# Patient Record
Sex: Female | Born: 1935 | Race: White | Hispanic: No | Marital: Single | Smoking: Never smoker
Health system: Southern US, Community
[De-identification: ages and names within clinical notes are randomized; demographics above are authoritative.]

## PROBLEM LIST (undated history)

## (undated) DIAGNOSIS — D649 Anemia, unspecified: Secondary | ICD-10-CM

## (undated) DIAGNOSIS — K449 Diaphragmatic hernia without obstruction or gangrene: Secondary | ICD-10-CM

## (undated) DIAGNOSIS — E119 Type 2 diabetes mellitus without complications: Secondary | ICD-10-CM

## (undated) DIAGNOSIS — K7460 Unspecified cirrhosis of liver: Secondary | ICD-10-CM

## (undated) DIAGNOSIS — K259 Gastric ulcer, unspecified as acute or chronic, without hemorrhage or perforation: Secondary | ICD-10-CM

## (undated) HISTORY — PX: CHOLECYSTECTOMY: SHX55

## (undated) HISTORY — DX: Anemia, unspecified: D64.9

## (undated) HISTORY — PX: KNEE SURGERY: SHX244

## (undated) HISTORY — DX: Unspecified cirrhosis of liver: K74.60

---

## 2010-10-02 ENCOUNTER — Ambulatory Visit: Payer: Self-pay | Admitting: Internal Medicine

## 2011-09-25 ENCOUNTER — Encounter (INDEPENDENT_AMBULATORY_CARE_PROVIDER_SITE_OTHER): Payer: Self-pay | Admitting: *Deleted

## 2011-10-04 ENCOUNTER — Ambulatory Visit (INDEPENDENT_AMBULATORY_CARE_PROVIDER_SITE_OTHER): Payer: Medicare Other | Admitting: Internal Medicine

## 2011-10-04 ENCOUNTER — Encounter (INDEPENDENT_AMBULATORY_CARE_PROVIDER_SITE_OTHER): Payer: Self-pay | Admitting: Internal Medicine

## 2011-10-04 VITALS — BP 120/64 | HR 60 | Temp 98.4°F | Ht 63.0 in | Wt 162.2 lb

## 2011-10-04 DIAGNOSIS — D509 Iron deficiency anemia, unspecified: Secondary | ICD-10-CM

## 2011-10-04 DIAGNOSIS — K7460 Unspecified cirrhosis of liver: Secondary | ICD-10-CM

## 2011-10-04 NOTE — Progress Notes (Signed)
Subjective:     Patient ID: Amanda Gay, female   DOB: [birth year], 77 y.o.   MRN: [id number]  HPI  Amanda Gay is a 77 yr old female here today for f/u.  She has a hx of iron deficiency anemia.  Her last iron infusion was in 02/01/2011.  She see Dr. Ubaldo Glassing for this. Her last colonoscopy was either in 2011 or 2010 which was normal except for a polyp per patient.  She also has a hx of hepatic cirrhosis and splenomegaly.  She will have some swelling in her lower ankles if she stand a lot or rides a lot. No periods of confusion. No confusion.  No asterisk.  Her appetite is good though she tells me she has problems with taste. 10/04/2011 H and H 13.5 and 39.0, Platelet ct 115 down from 140. Iron 74, Transferrin 241, TIBC 337, Saturation 22.   No melena or bright red rectal bleeding.  Usually has one BM daily. Review of Systems see hpi Current Outpatient Prescriptions  Medication Sig Dispense Refill  . diltiazem (TIAZAC) 360 MG 24 hr capsule Take 360 mg by mouth daily.        . furosemide (LASIX) 20 MG tablet Take 20 mg by mouth 2 (two) times daily.        . metFORMIN (GLUMETZA) 500 MG (MOD) 24 hr tablet Take 500 mg by mouth daily with breakfast. Two in am and one at night       . pantoprazole (PROTONIX) 40 MG tablet Take 40 mg by mouth daily.        . potassium chloride (KLOR-CON) 10 MEQ CR tablet Take 10 mEq by mouth daily.        . propranolol (INDERAL) 20 MG tablet Take 20 mg by mouth 3 (three) times daily.        Marland Kitchen spironolactone (ALDACTONE) 50 MG tablet Take 50 mg by mouth daily.        . temazepam (RESTORIL) 15 MG capsule Take 15 mg by mouth at bedtime as needed.        NKA    Objective:   Physical Exam Filed Vitals:   10/04/11 1604  BP: 120/64  Pulse: 60  Temp: 98.4 F (36.9 C)  Height: 5\' 3"  (1.6 m)  Weight: 162 lb 3.2 oz (73.573 kg)   Alert and oriented. Skin warm and dry. Oral mucosa is moist. Natural teeth in good condition. Sclera anicteric, conjunctivae is pink. Thyroid not  enlarged. No cervical lymphadenopathy. Lungs clear. Heart regular rate and rhythm.  Abdomen is soft. Bowel sounds are positive. No hepatomegaly.Unable to palpate the spleen.  No abdominal masses felt. No tenderness.  No edema to lower extremities. Patient is alert and oriented. No asterisk.     Assessment:    Hx of cirrhoisis either cryptogenic or secondary to NAFLD with grade 1-2 varies.  Iron deficiency anemia followed by Dr Ubaldo Glassing. Her H and H 10/04/11 13.5 and 39.0    Plan:    Follow up in one year. Will get a C-met and an AFP on her today. Amanda Gay was satisfied with the care she received today. All questions were answered.

## 2011-10-04 NOTE — Patient Instructions (Signed)
Will get a Cmet and AFP on her today and she will follow up in one year.

## 2011-10-05 LAB — COMPREHENSIVE METABOLIC PANEL
AST: 23 U/L (ref 0–37)
Alkaline Phosphatase: 83 U/L (ref 39–117)
BUN: 18 mg/dL (ref 6–23)
Creat: 0.82 mg/dL (ref 0.50–1.10)
Potassium: 4.1 mEq/L (ref 3.5–5.3)

## 2012-09-24 ENCOUNTER — Encounter (INDEPENDENT_AMBULATORY_CARE_PROVIDER_SITE_OTHER): Payer: Self-pay | Admitting: *Deleted

## 2012-10-06 ENCOUNTER — Ambulatory Visit (INDEPENDENT_AMBULATORY_CARE_PROVIDER_SITE_OTHER): Payer: Medicare Other | Admitting: Internal Medicine

## 2012-10-08 ENCOUNTER — Encounter: Payer: Medicare Other | Admitting: Internal Medicine

## 2012-10-08 DIAGNOSIS — D638 Anemia in other chronic diseases classified elsewhere: Secondary | ICD-10-CM

## 2012-10-08 DIAGNOSIS — D509 Iron deficiency anemia, unspecified: Secondary | ICD-10-CM

## 2012-10-08 DIAGNOSIS — E538 Deficiency of other specified B group vitamins: Secondary | ICD-10-CM

## 2012-10-15 DIAGNOSIS — D649 Anemia, unspecified: Secondary | ICD-10-CM

## 2012-10-15 DIAGNOSIS — E538 Deficiency of other specified B group vitamins: Secondary | ICD-10-CM

## 2012-10-17 DIAGNOSIS — D649 Anemia, unspecified: Secondary | ICD-10-CM

## 2012-11-11 DIAGNOSIS — L259 Unspecified contact dermatitis, unspecified cause: Secondary | ICD-10-CM

## 2012-11-11 DIAGNOSIS — D509 Iron deficiency anemia, unspecified: Secondary | ICD-10-CM

## 2012-11-11 DIAGNOSIS — E538 Deficiency of other specified B group vitamins: Secondary | ICD-10-CM

## 2012-12-12 DIAGNOSIS — E538 Deficiency of other specified B group vitamins: Secondary | ICD-10-CM

## 2012-12-12 DIAGNOSIS — D509 Iron deficiency anemia, unspecified: Secondary | ICD-10-CM

## 2012-12-12 DIAGNOSIS — R5381 Other malaise: Secondary | ICD-10-CM

## 2012-12-12 DIAGNOSIS — R5383 Other fatigue: Secondary | ICD-10-CM

## 2013-01-08 DIAGNOSIS — D649 Anemia, unspecified: Secondary | ICD-10-CM

## 2013-01-08 DIAGNOSIS — E538 Deficiency of other specified B group vitamins: Secondary | ICD-10-CM

## 2013-02-05 DIAGNOSIS — E538 Deficiency of other specified B group vitamins: Secondary | ICD-10-CM

## 2013-02-05 DIAGNOSIS — D649 Anemia, unspecified: Secondary | ICD-10-CM

## 2013-02-05 DIAGNOSIS — R161 Splenomegaly, not elsewhere classified: Secondary | ICD-10-CM

## 2013-02-16 DIAGNOSIS — D509 Iron deficiency anemia, unspecified: Secondary | ICD-10-CM

## 2013-02-23 DIAGNOSIS — D509 Iron deficiency anemia, unspecified: Secondary | ICD-10-CM

## 2013-03-05 DIAGNOSIS — E538 Deficiency of other specified B group vitamins: Secondary | ICD-10-CM

## 2013-04-02 ENCOUNTER — Encounter: Payer: Medicare Other | Admitting: Internal Medicine

## 2013-04-02 DIAGNOSIS — D638 Anemia in other chronic diseases classified elsewhere: Secondary | ICD-10-CM

## 2013-04-02 DIAGNOSIS — D509 Iron deficiency anemia, unspecified: Secondary | ICD-10-CM

## 2013-04-02 DIAGNOSIS — E538 Deficiency of other specified B group vitamins: Secondary | ICD-10-CM

## 2013-05-05 DIAGNOSIS — E538 Deficiency of other specified B group vitamins: Secondary | ICD-10-CM

## 2013-06-02 DIAGNOSIS — E538 Deficiency of other specified B group vitamins: Secondary | ICD-10-CM

## 2013-06-02 DIAGNOSIS — D649 Anemia, unspecified: Secondary | ICD-10-CM

## 2013-07-02 DIAGNOSIS — E538 Deficiency of other specified B group vitamins: Secondary | ICD-10-CM

## 2013-07-02 DIAGNOSIS — D649 Anemia, unspecified: Secondary | ICD-10-CM

## 2013-07-02 DIAGNOSIS — R161 Splenomegaly, not elsewhere classified: Secondary | ICD-10-CM

## 2013-08-03 DIAGNOSIS — E538 Deficiency of other specified B group vitamins: Secondary | ICD-10-CM

## 2013-09-02 DIAGNOSIS — E538 Deficiency of other specified B group vitamins: Secondary | ICD-10-CM

## 2013-10-05 DIAGNOSIS — E538 Deficiency of other specified B group vitamins: Secondary | ICD-10-CM

## 2013-10-20 DIAGNOSIS — D649 Anemia, unspecified: Secondary | ICD-10-CM

## 2013-10-20 DIAGNOSIS — D509 Iron deficiency anemia, unspecified: Secondary | ICD-10-CM

## 2013-10-20 DIAGNOSIS — M79609 Pain in unspecified limb: Secondary | ICD-10-CM

## 2013-10-20 DIAGNOSIS — M810 Age-related osteoporosis without current pathological fracture: Secondary | ICD-10-CM

## 2013-10-20 DIAGNOSIS — K7460 Unspecified cirrhosis of liver: Secondary | ICD-10-CM

## 2013-10-28 DIAGNOSIS — D509 Iron deficiency anemia, unspecified: Secondary | ICD-10-CM

## 2013-11-05 DIAGNOSIS — M79609 Pain in unspecified limb: Secondary | ICD-10-CM

## 2013-11-05 DIAGNOSIS — D509 Iron deficiency anemia, unspecified: Secondary | ICD-10-CM

## 2013-11-05 DIAGNOSIS — K7460 Unspecified cirrhosis of liver: Secondary | ICD-10-CM

## 2013-11-05 DIAGNOSIS — I10 Essential (primary) hypertension: Secondary | ICD-10-CM

## 2013-11-05 DIAGNOSIS — E538 Deficiency of other specified B group vitamins: Secondary | ICD-10-CM

## 2013-11-05 DIAGNOSIS — D649 Anemia, unspecified: Secondary | ICD-10-CM

## 2013-11-11 DIAGNOSIS — D509 Iron deficiency anemia, unspecified: Secondary | ICD-10-CM

## 2015-01-10 ENCOUNTER — Emergency Department (HOSPITAL_COMMUNITY): Payer: Medicare Other

## 2015-01-10 ENCOUNTER — Emergency Department (HOSPITAL_COMMUNITY)
Admission: EM | Admit: 2015-01-10 | Discharge: 2015-01-10 | Disposition: A | Discharge status: Home / Self Care | Payer: Medicare Other | Attending: Emergency Medicine | Admitting: Emergency Medicine

## 2015-01-10 ENCOUNTER — Encounter (HOSPITAL_COMMUNITY): Payer: Self-pay | Admitting: Emergency Medicine

## 2015-01-10 DIAGNOSIS — D649 Anemia, unspecified: Secondary | ICD-10-CM | POA: Insufficient documentation

## 2015-01-10 DIAGNOSIS — Z794 Long term (current) use of insulin: Secondary | ICD-10-CM | POA: Insufficient documentation

## 2015-01-10 DIAGNOSIS — E119 Type 2 diabetes mellitus without complications: Secondary | ICD-10-CM | POA: Diagnosis not present

## 2015-01-10 DIAGNOSIS — R1013 Epigastric pain: Secondary | ICD-10-CM | POA: Diagnosis present

## 2015-01-10 DIAGNOSIS — K7030 Alcoholic cirrhosis of liver without ascites: Secondary | ICD-10-CM | POA: Insufficient documentation

## 2015-01-10 DIAGNOSIS — R109 Unspecified abdominal pain: Secondary | ICD-10-CM

## 2015-01-10 DIAGNOSIS — K259 Gastric ulcer, unspecified as acute or chronic, without hemorrhage or perforation: Secondary | ICD-10-CM | POA: Diagnosis not present

## 2015-01-10 DIAGNOSIS — Z79899 Other long term (current) drug therapy: Secondary | ICD-10-CM | POA: Diagnosis not present

## 2015-01-10 HISTORY — DX: Gastric ulcer, unspecified as acute or chronic, without hemorrhage or perforation: K25.9

## 2015-01-10 HISTORY — DX: Type 2 diabetes mellitus without complications: E11.9

## 2015-01-10 HISTORY — DX: Diaphragmatic hernia without obstruction or gangrene: K44.9

## 2015-01-10 LAB — CBC WITH DIFFERENTIAL/PLATELET
BASOS ABS: 0.1 10*3/uL (ref 0.0–0.1)
BASOS PCT: 1 % (ref 0–1)
Eosinophils Absolute: 0.2 10*3/uL (ref 0.0–0.7)
Eosinophils Relative: 2 % (ref 0–5)
HCT: 31.3 % — ABNORMAL LOW (ref 36.0–46.0)
HEMOGLOBIN: 10 g/dL — AB (ref 12.0–15.0)
LYMPHS PCT: 21 % (ref 12–46)
Lymphs Abs: 2 10*3/uL (ref 0.7–4.0)
MCH: 27.8 pg (ref 26.0–34.0)
MCHC: 31.9 g/dL (ref 30.0–36.0)
MCV: 86.9 fL (ref 78.0–100.0)
Monocytes Absolute: 0.7 10*3/uL (ref 0.1–1.0)
Monocytes Relative: 7 % (ref 3–12)
NEUTROS ABS: 6.8 10*3/uL (ref 1.7–7.7)
Neutrophils Relative %: 69 % (ref 43–77)
PLATELETS: 211 10*3/uL (ref 150–400)
RBC: 3.6 MIL/uL — ABNORMAL LOW (ref 3.87–5.11)
RDW: 14.2 % (ref 11.5–15.5)
WBC: 9.8 10*3/uL (ref 4.0–10.5)

## 2015-01-10 LAB — COMPREHENSIVE METABOLIC PANEL
ALBUMIN: 3.7 g/dL (ref 3.5–5.2)
ALT: 14 U/L (ref 0–35)
AST: 27 U/L (ref 0–37)
Alkaline Phosphatase: 109 U/L (ref 39–117)
Anion gap: 8 (ref 5–15)
BUN: 26 mg/dL — AB (ref 6–23)
CO2: 24 mmol/L (ref 19–32)
CREATININE: 1.45 mg/dL — AB (ref 0.50–1.10)
Calcium: 9.6 mg/dL (ref 8.4–10.5)
Chloride: 107 mmol/L (ref 96–112)
GFR calc Af Amer: 38 mL/min — ABNORMAL LOW (ref >90)
GFR calc non Af Amer: 33 mL/min — ABNORMAL LOW (ref >90)
Glucose, Bld: 123 mg/dL — ABNORMAL HIGH (ref 70–99)
Potassium: 4.8 mmol/L (ref 3.5–5.1)
SODIUM: 139 mmol/L (ref 135–145)
TOTAL PROTEIN: 7.3 g/dL (ref 6.0–8.3)
Total Bilirubin: 1 mg/dL (ref 0.3–1.2)

## 2015-01-10 LAB — LIPASE, BLOOD: LIPASE: 27 U/L (ref 11–59)

## 2015-01-10 NOTE — ED Provider Notes (Signed)
CSN: [id number]     Arrival date & time 01/10/15  1256 History   First MD Initiated Contact with Patient 01/10/15 1604     Chief Complaint  Patient presents with  . Abdominal Pain     (Consider location/radiation/quality/duration/timing/severity/associated sxs/prior Treatment) Patient is a 81 y.o. female presenting with abdominal pain. The history is provided by the patient and a relative. No language interpreter was used.  Abdominal Pain Pain location:  Epigastric and LUQ Pain quality: dull   Pain radiates to:  Does not radiate Pain severity:  Moderate Onset quality:  Gradual Duration:  3 weeks Timing:  Intermittent Progression:  Unchanged Chronicity:  New Relieved by:  Nothing Worsened by:  Movement and palpation Ineffective treatments:  None tried Associated symptoms: no anorexia, no chest pain, no chills, no cough, no dysuria, no fever, no hematuria, no nausea, no shortness of breath and no vomiting   Risk factors: being elderly   Risk factors: no aspirin use, has not had multiple surgeries and not obese     Past Medical History  Diagnosis Date  . Cirrhosis of liver without mention of alcohol   . Anemia   . Diabetes mellitus without complication   . Hiatal hernia   . Gastric ulcer    History reviewed. No pertinent past surgical history. History reviewed. No pertinent family history. History  Substance Use Topics  . Smoking status: Never Smoker   . Smokeless tobacco: Not on file  . Alcohol Use: Yes     Comment: rare occasion.   OB History    No data available     Review of Systems  Constitutional: Negative for fever and chills.  Respiratory: Negative for cough and shortness of breath.   Cardiovascular: Negative for chest pain and leg swelling.  Gastrointestinal: Positive for abdominal pain. Negative for nausea, vomiting and anorexia.  Genitourinary: Negative for dysuria, urgency and hematuria.  All other systems reviewed and are negative.     Allergies   Review of patient's allergies indicates no known allergies.  Home Medications   Prior to Admission medications   Medication Sig Start Date End Date Taking? Authorizing Provider  amLODipine (NORVASC) 2.5 MG tablet Take 2.5 mg by mouth daily.   Yes Historical Provider, MD  Cholecalciferol (VITAMIN D) 2000 UNITS CAPS Take 1 capsule by mouth daily.   Yes Historical Provider, MD  Cinnamon 500 MG TABS Take 1 tablet by mouth daily.   Yes Historical Provider, MD  folic acid (FOLVITE) 1 MG tablet Take 1 mg by mouth daily.   Yes Historical Provider, MD  furosemide (LASIX) 20 MG tablet Take 20 mg by mouth daily.    Yes Historical Provider, MD  HYDROcodone-acetaminophen (NORCO/VICODIN) 5-325 MG per tablet Take 1 tablet by mouth every 4 (four) hours as needed for moderate pain.   Yes Historical Provider, MD  Insulin Glargine 300 UNIT/ML SOPN Inject 22 Units into the skin every morning.   Yes Historical Provider, MD  metFORMIN (GLUCOPHAGE) 500 MG tablet Take 500 mg by mouth 2 (two) times daily with a meal.   Yes Historical Provider, MD  nadolol (CORGARD) 40 MG tablet Take 20 mg by mouth daily.   Yes Historical Provider, MD  pantoprazole (PROTONIX) 40 MG tablet Take 40 mg by mouth 2 (two) times daily.    Yes Historical Provider, MD  spironolactone (ALDACTONE) 50 MG tablet Take 50 mg by mouth daily.     Yes Historical Provider, MD  vitamin C (ASCORBIC ACID) 500 MG tablet Take  500 mg by mouth daily.   Yes Historical Provider, MD   BP 139/61 mmHg  Pulse 69  Temp(Src) 97.9 F (36.6 C) (Oral)  Resp 22  Ht 5\' 3"  (1.6 m)  Wt 150 lb (68.04 kg)  BMI 26.58 kg/m2  SpO2 99% Physical Exam  Constitutional: She is oriented to person, place, and time. She appears well-developed and well-nourished. No distress.  HENT:  Head: Normocephalic and atraumatic.  Eyes: Pupils are equal, round, and reactive to light.  Neck: Normal range of motion.  Cardiovascular: Normal rate, regular rhythm, normal heart sounds and  intact distal pulses.   Pulmonary/Chest: Effort normal. No respiratory distress. She has no wheezes. She exhibits no tenderness.  Abdominal: Soft. Bowel sounds are normal. She exhibits no distension. There is no tenderness. There is no rebound and no guarding.  Neurological: She is alert and oriented to person, place, and time. She has normal strength. No cranial nerve deficit or sensory deficit. She exhibits normal muscle tone. Coordination and gait normal.  Skin: Skin is warm and dry.  Nursing note and vitals reviewed.   ED Course  Procedures (including critical care time) Labs Review Labs Reviewed  CBC WITH DIFFERENTIAL/PLATELET - Abnormal; Notable for the following:    RBC 3.60 (*)    Hemoglobin 10.0 (*)    HCT 31.3 (*)    All other components within normal limits  COMPREHENSIVE METABOLIC PANEL - Abnormal; Notable for the following:    Glucose, Bld 123 (*)    BUN 26 (*)    Creatinine, Ser 1.45 (*)    GFR calc non Af Amer 33 (*)    GFR calc Af Amer 38 (*)    All other components within normal limits  LIPASE, BLOOD    Imaging Review Dg Chest Portable 1 View  01/10/2015   CLINICAL DATA:  Upper abdominal pain of 3-4 weeks duration.  EXAM: PORTABLE CHEST - 1 VIEW  COMPARISON:  12/27/2014  FINDINGS: There is moderate cardiomegaly and mild aortic tortuosity, unchanged. The lungs are clear. There are no large effusions.  IMPRESSION: Unchanged moderate cardiomegaly.  No acute findings.   Electronically Signed   By: Andreas Newport M.D.   On: 01/10/2015 16:35     EKG Interpretation None      MDM   Final diagnoses:  Abdominal pain  Alcoholic cirrhosis of liver without ascites   Patient is an 81 year old Caucasian female with pertinent past medical history of cirrhosis and duodenal stenosis who comes to the emergency department today with abdominal pain that has been present for the past 3 weeks. Physical exam as above. Patient brought records with her from [facility]  where she was admitted a week ago for the same abdominal pain.  This demonstrated that she received a CT of her abdomen which demonstrated cirrhosis of her liver with portal hypertension. She had a cardiac evaluation with negative troponins. She had an EGD which demonstrated esophageal varices and duodenal stenosis. She comes to the emergency department today with continued pain requesting to be seen by GI for further evaluation of her duodenal stenosis. With pain for 3 weeks and no change in her symptoms, well appearance, no tachycardia and hypotension I doubt a mesenteric ischemia.  Initial workup included a CBC, CMP, lipase, chest x-ray, and an EKG. EKG is detailed above. Chest x-ray demonstrated no consolidations or pneumonia. There was no evidence of free air under the diaphragm.  I doubt a bowel perforation I have low suspicion of this at this  time. CBC was unremarkable. CMP with a creatinine of 1.45 which is close to the patient's previous creatinine of 1.2 at [facility]. Lipase is 27 as result I doubt a pancreatitis. Patient refused any treatment for the abdominal pain at this time stating that she felt very comfortable. Patient was felt to be stable for discharge at this time I doubt she has an acute life-threatening cause of her abdominal pain. Patient was provided with a GI follow-up number and instructed to call them for an appointment within a week. She was instructed to return to the emergency department with inability to tolerate PO, worsening pain, or any other concerns. The patient expressed understanding. She was discharged in good condition. Labs and imaging reviewed by myself and considered in medical decision making. Imaging was interpreted by radiology. Care was discussed with my attending Dr. Jeanell Sparrow.      Katheren Shams, MD 01/11/15 [place], MD 01/11/15 [phone]

## 2015-01-10 NOTE — Discharge Instructions (Signed)

## 2015-01-10 NOTE — ED Notes (Addendum)
MD Ray at bedside. 

## 2015-01-10 NOTE — ED Notes (Signed)
Pt here for upper abd pain; pt seen at [facility] and had multiple tests but no diagnosis; pt here for second opinion and eval

## 2015-01-10 NOTE — ED Notes (Signed)
Pt monitored by pulse ox, bp cuff, and 12-lead.  MD at bedside.

## 2015-01-20 ENCOUNTER — Encounter (INDEPENDENT_AMBULATORY_CARE_PROVIDER_SITE_OTHER): Payer: Self-pay | Admitting: *Deleted

## 2015-01-27 ENCOUNTER — Encounter (INDEPENDENT_AMBULATORY_CARE_PROVIDER_SITE_OTHER): Payer: Self-pay | Admitting: Internal Medicine

## 2015-01-27 ENCOUNTER — Ambulatory Visit (INDEPENDENT_AMBULATORY_CARE_PROVIDER_SITE_OTHER): Payer: Medicare Other | Admitting: Internal Medicine

## 2015-01-27 ENCOUNTER — Encounter (INDEPENDENT_AMBULATORY_CARE_PROVIDER_SITE_OTHER): Payer: Self-pay | Admitting: *Deleted

## 2015-01-27 VITALS — BP 126/58 | HR 60 | Temp 98.1°F | Ht 62.0 in | Wt 146.2 lb

## 2015-01-27 DIAGNOSIS — M546 Pain in thoracic spine: Secondary | ICD-10-CM

## 2015-01-27 DIAGNOSIS — R1013 Epigastric pain: Secondary | ICD-10-CM

## 2015-01-27 NOTE — Patient Instructions (Signed)
CT thoracic spine.

## 2015-01-27 NOTE — Progress Notes (Signed)
Subjective:    Patient ID: Amanda Gay, female    DOB: Apr 22, 1930, 81 y.o.   MRN: [id number]  HPI Admitted to [facility] in January of this year for epigastric pain radiating into back. She was admitted x 2 days.She had had pain for about a week before admission to [facility].   She says the pain was extreme. Pain would occur bending over or turning around. She now states she has back pain. The abdominal  pain occurs on movement.  When she eats, foods do not bother her.  Eating does not cause pain.   She says she has been hurting all day today, but the pain is not as sharp. She was sent over to [facility] from [facility] and evaluated and discharged home same day.  She underwent an EGD 1//2016 at [facility]: Small varices in the distal esophagus. Varices were not bleeding. Stomach: There was gastritis in the antrum.  Short benign appearing stricture in the 1st part of the duodenum.(Dr. Britta Mccreedy).  There is no dysphagia. Appetite is good. No weight loss. Pain is not related to food intake.  There has been no injury, fever, nausea or vomiting.  BMs are normal. Diabetic x 10 years. Hx of cirrhosis  Abdomen/ 2 views: Rounded calcification in the left upper quadrant is consistent with splenic artery aneurysm and measures 1.3cm. There is a nonobstructive bowel gas pattern. Degenerative changes are seen in the lower lumbar spine. No evidence of acute abnormality.   12/28/2014 CT abdomen/pelvis with CM:  Cirrhotic liver with evidence of portal hypertension, perisplenic collaterals, spontaneous splenorenal shunt, esophageal varies and minimal ascites.  12/27/2014 Chest xray: No evidence for acute abnormality. Compression deformity at T7 and T9, similar to prior exam.   CBC Latest Ref Rng 01/10/2015  WBC 4.0 - 10.5 K/uL 9.8  Hemoglobin 12.0 - 15.0 g/dL 10.0(L)  Hematocrit 36.0 - 46.0 % 31.3(L)  Platelets 150 - 400 K/uL 211   CMP     Component Value Date/Time   NA 139 01/10/2015 1326   K 4.8 01/10/2015 1326   CL 107  01/10/2015 1326   CO2 24 01/10/2015 1326   GLUCOSE 123* 01/10/2015 1326   BUN 26* 01/10/2015 1326   CREATININE 1.45* 01/10/2015 1326   CREATININE 0.82 10/04/2011 1616   CALCIUM 9.6 01/10/2015 1326   PROT 7.3 01/10/2015 1326   ALBUMIN 3.7 01/10/2015 1326   AST 27 01/10/2015 1326   ALT 14 01/10/2015 1326   ALKPHOS 109 01/10/2015 1326   BILITOT 1.0 01/10/2015 1326   GFRNONAA 33* 01/10/2015 1326   GFRAA 38* 01/10/2015 1326        Labs 12/27/2014 NA 139,  K 4.2, BUN 20, Creatinine 1.14, Glucose 172, Calcium 9.5, Total bili 0.7, AST 29, ALT 22, ALP 106, Troponin less than 0.01. Amylase 71. Albumin 71., Lipase 17, WBC 6.2, RBC 3.48, H and H 9.8 and 30.7, MCV 88, Platelet ct 149  Review of Systems Past Medical History  Diagnosis Date  . Cirrhosis of liver without mention of alcohol   . Anemia   . Diabetes mellitus without complication   . Hiatal hernia   . Gastric ulcer     Past Surgical History  Procedure Laterality Date  . Cholecystectomy      over 20 yrs ago  . Knee surgery      No Known Allergies  Current Outpatient Prescriptions on File Prior to Visit  Medication Sig Dispense Refill  . Cholecalciferol (VITAMIN D) 2000 UNITS CAPS Take 1 capsule by  mouth daily.    . Cinnamon 500 MG TABS Take 1 tablet by mouth daily.    . folic acid (FOLVITE) 1 MG tablet Take 1 mg by mouth daily.    . furosemide (LASIX) 20 MG tablet Take 20 mg by mouth daily.     . Insulin Glargine 300 UNIT/ML SOPN Inject 22 Units into the skin every morning.    . metFORMIN (GLUCOPHAGE) 500 MG tablet Take 500 mg by mouth 2 (two) times daily with a meal.    . nadolol (CORGARD) 40 MG tablet Take 20 mg by mouth daily.    . pantoprazole (PROTONIX) 40 MG tablet Take 40 mg by mouth 2 (two) times daily.     Marland Kitchen spironolactone (ALDACTONE) 50 MG tablet Take 50 mg by mouth daily.      . vitamin C (ASCORBIC ACID) 500 MG tablet Take 500 mg by mouth daily.    Marland Kitchen HYDROcodone-acetaminophen (NORCO/VICODIN) 5-325 MG per  tablet Take 1 tablet by mouth every 4 (four) hours as needed for moderate pain.     No current facility-administered medications on file prior to visit.        Objective:   Physical Exam  Filed Vitals:   01/27/15 1554  Height: 5\' 2"  (1.575 m)  Weight: 146 lb 3.2 oz (66.316 kg)   Alert and oriented. Skin warm and dry. Oral mucosa is moist.   . Sclera anicteric, conjunctivae is pink. Thyroid not enlarged. No cervical lymphadenopathy. Lungs clear. Heart regular rate and rhythm.  Abdomen is soft. Bowel sounds are positive. No hepatomegaly. No abdominal masses felt. No tenderness.  No edema to lower extremities.  Pain has severe thoracic back pain radiating into her upper abdomen upon lying and sitting on bed on exam today.         Assessment & Plan:  Back pain on movement. Vague epigastric pain ? Referred from back. EGD revealed small esophageal varices. Am going to get a CT of her Thoracic spine. Further recommendations to follow.

## 2015-01-28 LAB — CREATININE, SERUM: Creat: 1.02 mg/dL (ref 0.50–1.10)

## 2015-02-04 ENCOUNTER — Ambulatory Visit (HOSPITAL_COMMUNITY)
Admission: RE | Admit: 2015-02-04 | Discharge: 2015-02-04 | Disposition: A | Discharge status: Home / Self Care | Payer: Medicare Other | Source: Ambulatory Visit | Attending: Internal Medicine | Admitting: Internal Medicine

## 2015-02-04 DIAGNOSIS — M545 Low back pain: Secondary | ICD-10-CM | POA: Diagnosis not present

## 2015-02-04 DIAGNOSIS — M546 Pain in thoracic spine: Secondary | ICD-10-CM | POA: Diagnosis present

## 2015-02-04 DIAGNOSIS — R109 Unspecified abdominal pain: Secondary | ICD-10-CM | POA: Diagnosis not present

## 2015-04-21 ENCOUNTER — Encounter (INDEPENDENT_AMBULATORY_CARE_PROVIDER_SITE_OTHER): Payer: Self-pay

## 2015-12-22 DIAGNOSIS — E7800 Pure hypercholesterolemia, unspecified: Secondary | ICD-10-CM | POA: Diagnosis not present

## 2015-12-22 DIAGNOSIS — E1142 Type 2 diabetes mellitus with diabetic polyneuropathy: Secondary | ICD-10-CM | POA: Diagnosis not present

## 2016-02-16 DIAGNOSIS — I10 Essential (primary) hypertension: Secondary | ICD-10-CM | POA: Diagnosis not present

## 2016-02-16 DIAGNOSIS — Z789 Other specified health status: Secondary | ICD-10-CM | POA: Diagnosis not present

## 2016-02-16 DIAGNOSIS — N183 Chronic kidney disease, stage 3 (moderate): Secondary | ICD-10-CM | POA: Diagnosis not present

## 2016-02-16 DIAGNOSIS — E1142 Type 2 diabetes mellitus with diabetic polyneuropathy: Secondary | ICD-10-CM | POA: Diagnosis not present

## 2016-02-16 DIAGNOSIS — Z6825 Body mass index (BMI) 25.0-25.9, adult: Secondary | ICD-10-CM | POA: Diagnosis not present

## 2016-02-16 DIAGNOSIS — E1140 Type 2 diabetes mellitus with diabetic neuropathy, unspecified: Secondary | ICD-10-CM | POA: Diagnosis not present

## 2016-02-16 DIAGNOSIS — E1022 Type 1 diabetes mellitus with diabetic chronic kidney disease: Secondary | ICD-10-CM | POA: Diagnosis not present

## 2016-03-29 DIAGNOSIS — I10 Essential (primary) hypertension: Secondary | ICD-10-CM | POA: Diagnosis not present

## 2016-03-29 DIAGNOSIS — E1142 Type 2 diabetes mellitus with diabetic polyneuropathy: Secondary | ICD-10-CM | POA: Diagnosis not present

## 2016-03-29 DIAGNOSIS — R21 Rash and other nonspecific skin eruption: Secondary | ICD-10-CM | POA: Diagnosis not present

## 2016-04-12 DIAGNOSIS — H538 Other visual disturbances: Secondary | ICD-10-CM | POA: Diagnosis not present

## 2016-04-12 DIAGNOSIS — E113293 Type 2 diabetes mellitus with mild nonproliferative diabetic retinopathy without macular edema, bilateral: Secondary | ICD-10-CM | POA: Diagnosis not present

## 2016-04-25 DIAGNOSIS — M810 Age-related osteoporosis without current pathological fracture: Secondary | ICD-10-CM | POA: Diagnosis not present

## 2016-04-25 DIAGNOSIS — E538 Deficiency of other specified B group vitamins: Secondary | ICD-10-CM | POA: Diagnosis not present

## 2016-04-25 DIAGNOSIS — D638 Anemia in other chronic diseases classified elsewhere: Secondary | ICD-10-CM | POA: Diagnosis not present

## 2016-04-25 DIAGNOSIS — E611 Iron deficiency: Secondary | ICD-10-CM | POA: Diagnosis not present

## 2016-04-25 DIAGNOSIS — D518 Other vitamin B12 deficiency anemias: Secondary | ICD-10-CM | POA: Diagnosis not present

## 2016-04-30 DIAGNOSIS — R928 Other abnormal and inconclusive findings on diagnostic imaging of breast: Secondary | ICD-10-CM | POA: Diagnosis not present

## 2016-04-30 DIAGNOSIS — Z1231 Encounter for screening mammogram for malignant neoplasm of breast: Secondary | ICD-10-CM | POA: Diagnosis not present

## 2016-05-03 DIAGNOSIS — E119 Type 2 diabetes mellitus without complications: Secondary | ICD-10-CM | POA: Diagnosis not present

## 2016-05-03 DIAGNOSIS — E7800 Pure hypercholesterolemia, unspecified: Secondary | ICD-10-CM | POA: Diagnosis not present

## 2016-05-09 DIAGNOSIS — R928 Other abnormal and inconclusive findings on diagnostic imaging of breast: Secondary | ICD-10-CM | POA: Diagnosis not present

## 2016-05-09 DIAGNOSIS — N6489 Other specified disorders of breast: Secondary | ICD-10-CM | POA: Diagnosis not present

## 2016-05-17 IMAGING — CT CT T SPINE W/O CM
3 series · 11 of 33 positions shown, 13 images · non-contrast
Comparison: CT chest 11/15/2014

CLINICAL DATA: Midthoracic pain. Stomach pain with twisting
feeling. Low back pain 2 weeks. No surgery.

EXAM:
CT THORACIC SPINE WITHOUT CONTRAST
TECHNIQUE: Multidetector CT imaging of the thoracic spine was performed without
intravenous contrast administration. Multiplanar CT image
reconstructions were also generated.

[Series 2: thoracic spine 2.0 b30s · axial · 0.36mm/px · z∈[-211,-51]mm · 3 of 132 slices shown, 4 images]
[im 31/132  soft-tissue]
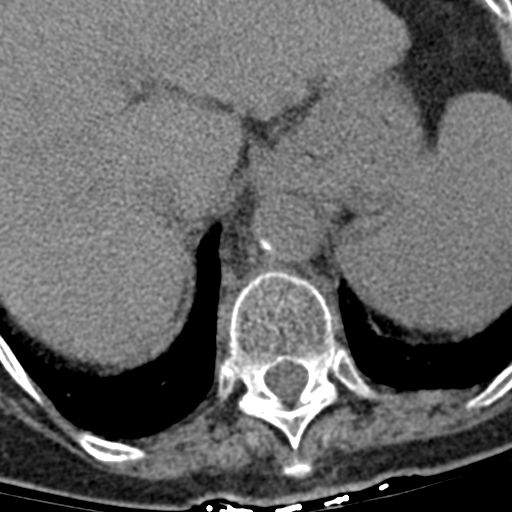
[im 31/132  bone]
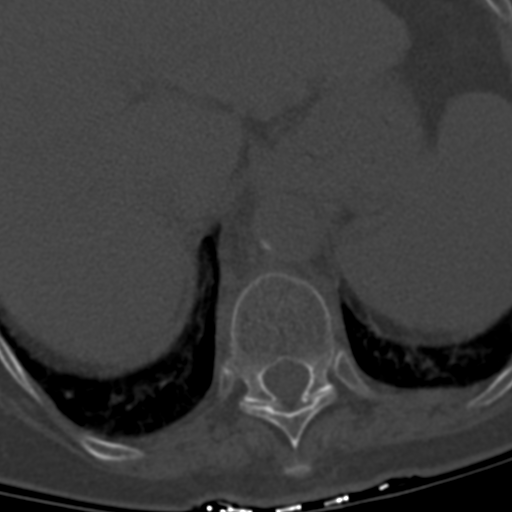
[im 71/132  bone]
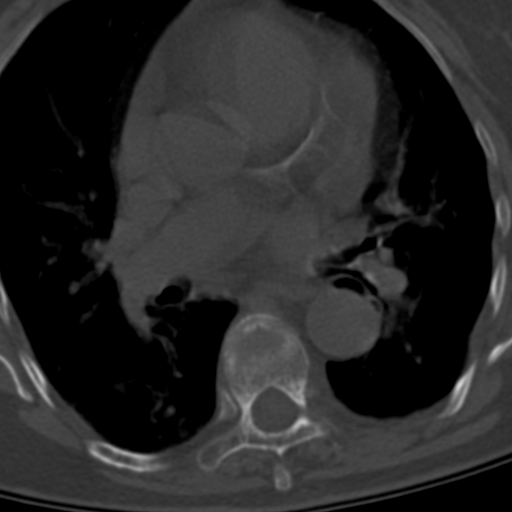
[im 111/132  bone]
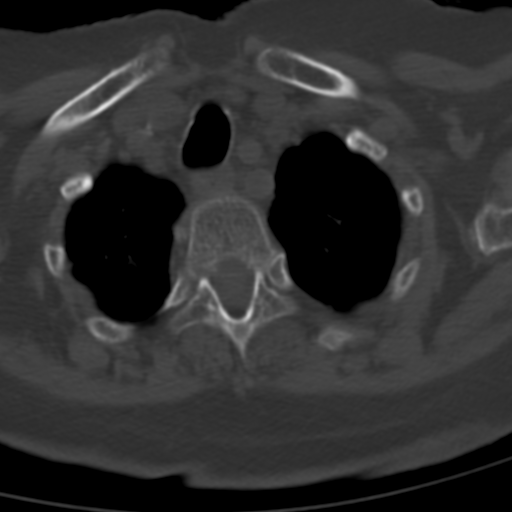

[Series 4: thoracic spine 2.0 spo · coronal · 0.28mm/px · 3 of 77 slices shown (1 of 2)]
[im 16/77  bone]
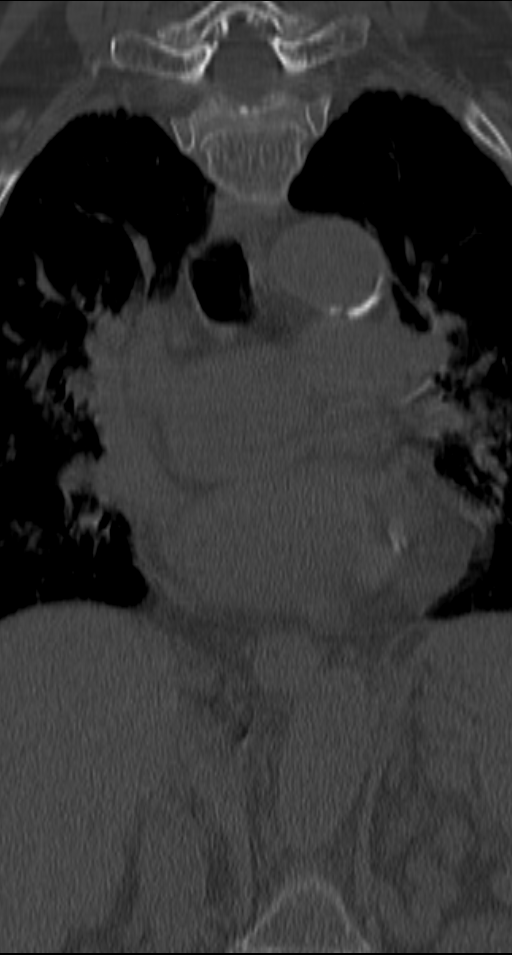
[im 31/77  bone]
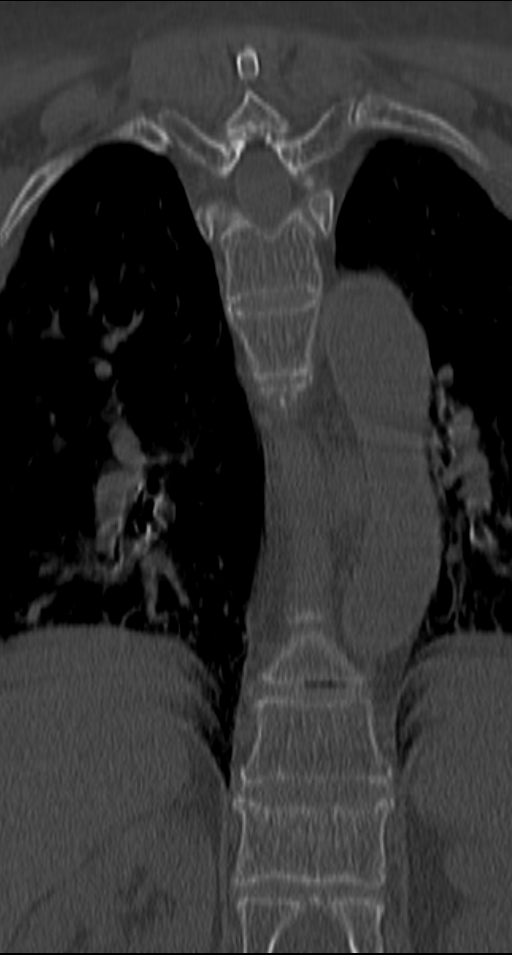
[im 46/77  bone]
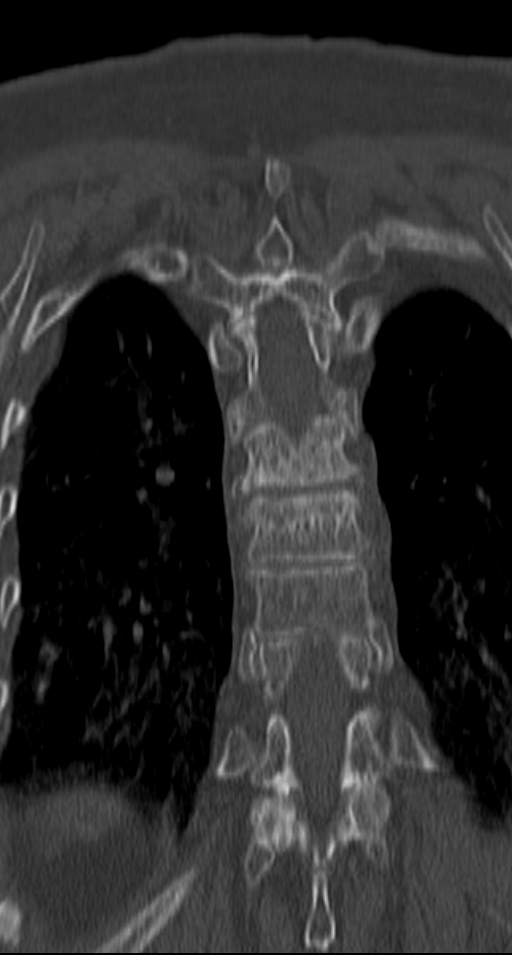

[Series 5: thoracic spine 2.0 spo · sagittal · 0.33mm/px · 5 of 80 slices shown, 6 images (2 of 2)]
[im 27/80  bone]
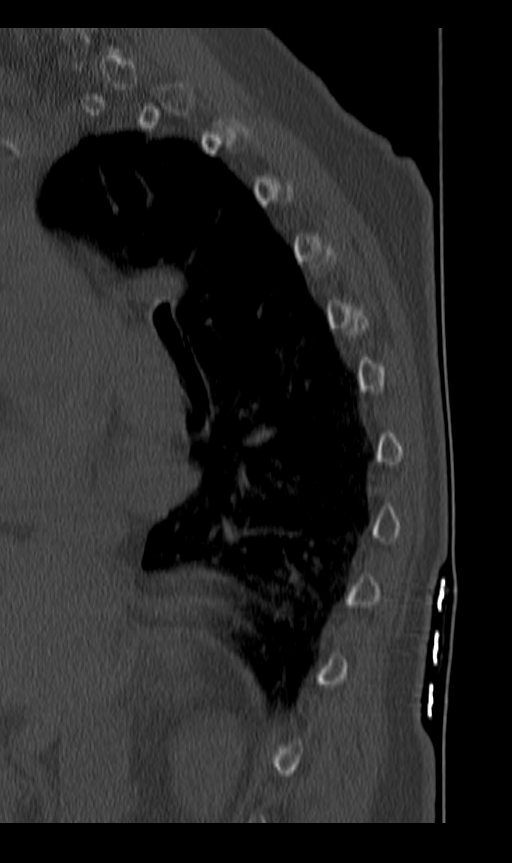
[im 33/80  bone]
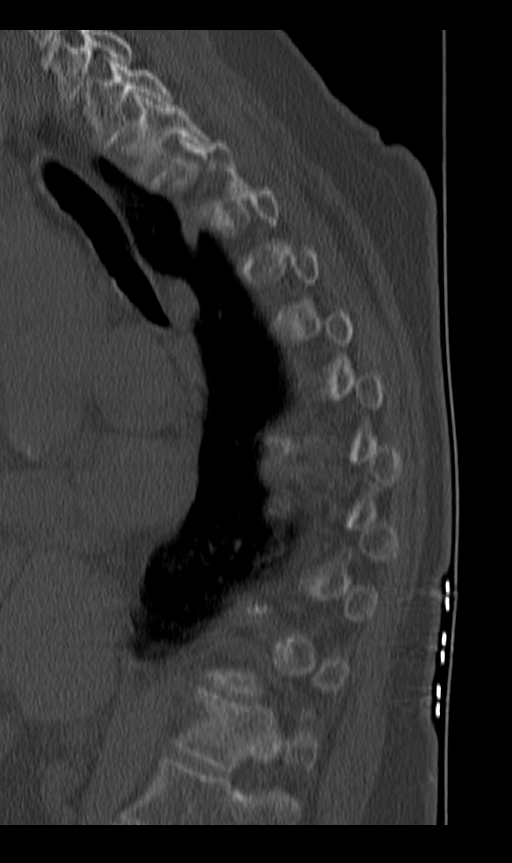
[im 40/80  soft-tissue]
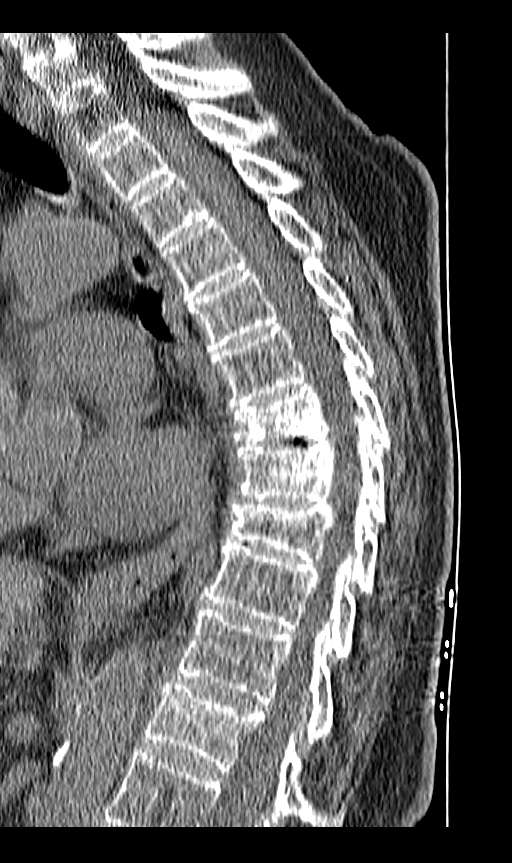
[im 40/80  bone]
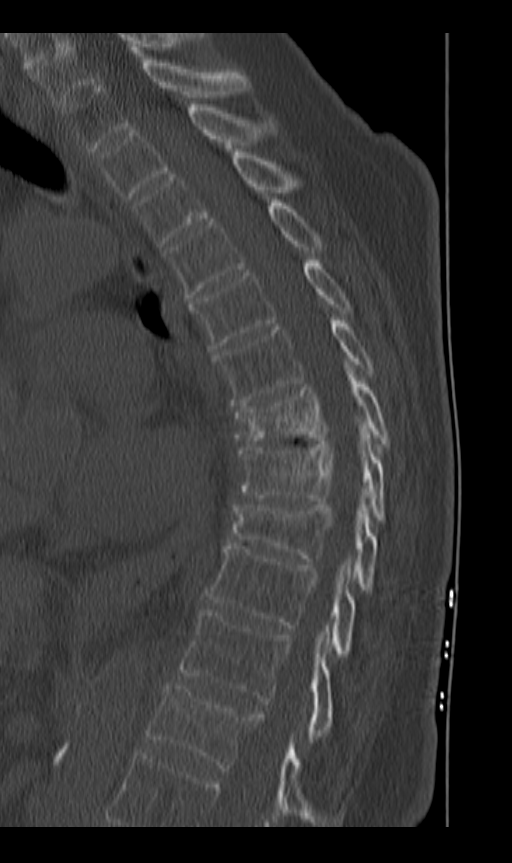
[im 47/80  bone]
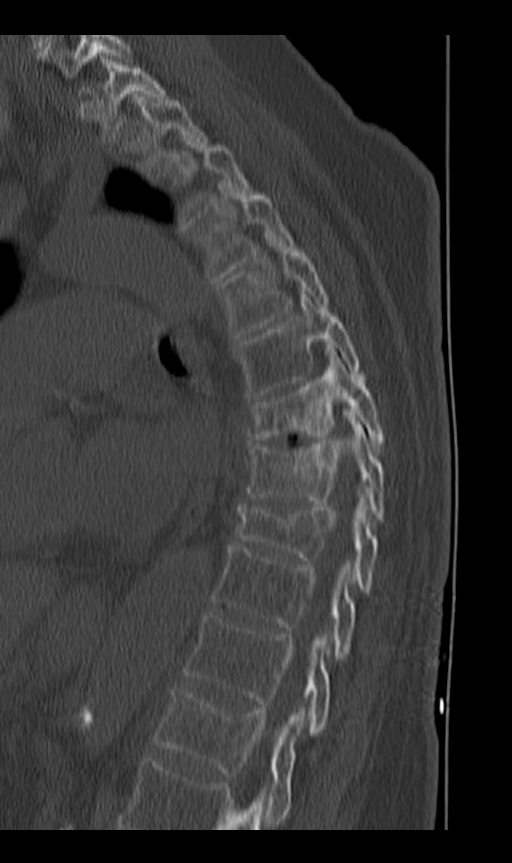
[im 53/80  bone]
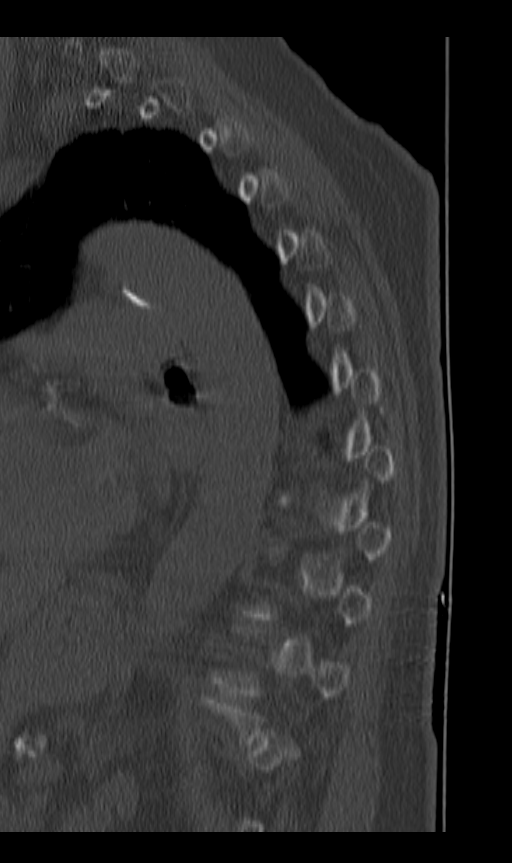

[11 of 33 positions shown; findings below may reference images not displayed]

FINDINGS: Mild spondylosis throughout the thoracic spine. Examination
demonstrates moderate compression fractures of T7 and T9 with
interval progression of the T7 fracture. There is also a mild
compression deformity of T8 new compared to the previous exam. Mild
compression fracture of T12 new compared to the prior exam. No
significant retropulsion of the compression fractures. No free
fragments within the spinal canal. Mild exaggerated kyphosis
centered over the 3 adjacent compression fractures from T7-T9.

Cardiomegaly and mild calcified plaque over the coronary arteries
and thoracic aorta. Minimal hazy central perivascular attenuation
suggesting mild vascular congestion/edema. Findings suggesting
calcified 1.6 cm splenic artery aneurysm. Previous cholecystectomy.
IMPRESSION: Mild spondylosis of the thoracic spine. Stable moderate T9
compression fracture. Interval progression of a moderate T7
compression fracture. New mild T8 and T12 compression fractures
compared to 11/15/2014. No significant retropulsion of the posterior
aspect of the compression fractures and no significant free
fragments within the spinal canal.

Suggestion mild vascular congestion/edema.

Mild cardiomegaly and atherosclerotic coronary artery disease.

## 2016-07-12 DIAGNOSIS — E1022 Type 1 diabetes mellitus with diabetic chronic kidney disease: Secondary | ICD-10-CM | POA: Diagnosis not present

## 2016-07-12 DIAGNOSIS — Z1389 Encounter for screening for other disorder: Secondary | ICD-10-CM | POA: Diagnosis not present

## 2016-07-12 DIAGNOSIS — N183 Chronic kidney disease, stage 3 (moderate): Secondary | ICD-10-CM | POA: Diagnosis not present

## 2016-07-17 DIAGNOSIS — Z7189 Other specified counseling: Secondary | ICD-10-CM | POA: Diagnosis not present

## 2016-07-17 DIAGNOSIS — Z0000 Encounter for general adult medical examination without abnormal findings: Secondary | ICD-10-CM | POA: Diagnosis not present

## 2016-07-17 DIAGNOSIS — Z6824 Body mass index (BMI) 24.0-24.9, adult: Secondary | ICD-10-CM | POA: Diagnosis not present

## 2016-07-17 DIAGNOSIS — Z1389 Encounter for screening for other disorder: Secondary | ICD-10-CM | POA: Diagnosis not present

## 2016-07-17 DIAGNOSIS — Z299 Encounter for prophylactic measures, unspecified: Secondary | ICD-10-CM | POA: Diagnosis not present

## 2016-07-17 DIAGNOSIS — Z1211 Encounter for screening for malignant neoplasm of colon: Secondary | ICD-10-CM | POA: Diagnosis not present

## 2016-07-18 DIAGNOSIS — E559 Vitamin D deficiency, unspecified: Secondary | ICD-10-CM | POA: Diagnosis not present

## 2016-07-18 DIAGNOSIS — E1022 Type 1 diabetes mellitus with diabetic chronic kidney disease: Secondary | ICD-10-CM | POA: Diagnosis not present

## 2016-07-18 DIAGNOSIS — R5383 Other fatigue: Secondary | ICD-10-CM | POA: Diagnosis not present

## 2016-07-18 DIAGNOSIS — E7800 Pure hypercholesterolemia, unspecified: Secondary | ICD-10-CM | POA: Diagnosis not present

## 2016-07-27 DIAGNOSIS — M7989 Other specified soft tissue disorders: Secondary | ICD-10-CM | POA: Diagnosis not present

## 2016-07-27 DIAGNOSIS — W19XXXA Unspecified fall, initial encounter: Secondary | ICD-10-CM | POA: Diagnosis not present

## 2016-07-27 DIAGNOSIS — Y92099 Unspecified place in other non-institutional residence as the place of occurrence of the external cause: Secondary | ICD-10-CM | POA: Diagnosis not present

## 2016-07-27 DIAGNOSIS — M1712 Unilateral primary osteoarthritis, left knee: Secondary | ICD-10-CM | POA: Diagnosis not present

## 2016-07-27 DIAGNOSIS — M2560 Stiffness of unspecified joint, not elsewhere classified: Secondary | ICD-10-CM | POA: Diagnosis not present

## 2016-07-27 DIAGNOSIS — M179 Osteoarthritis of knee, unspecified: Secondary | ICD-10-CM | POA: Diagnosis not present

## 2016-07-27 DIAGNOSIS — M25552 Pain in left hip: Secondary | ICD-10-CM | POA: Diagnosis not present

## 2016-07-27 DIAGNOSIS — M25562 Pain in left knee: Secondary | ICD-10-CM | POA: Diagnosis not present

## 2016-08-02 DIAGNOSIS — E119 Type 2 diabetes mellitus without complications: Secondary | ICD-10-CM | POA: Diagnosis not present

## 2016-08-02 DIAGNOSIS — E7800 Pure hypercholesterolemia, unspecified: Secondary | ICD-10-CM | POA: Diagnosis not present

## 2016-08-21 DIAGNOSIS — E2839 Other primary ovarian failure: Secondary | ICD-10-CM | POA: Diagnosis not present

## 2016-09-25 DIAGNOSIS — Z299 Encounter for prophylactic measures, unspecified: Secondary | ICD-10-CM | POA: Diagnosis not present

## 2016-09-25 DIAGNOSIS — I10 Essential (primary) hypertension: Secondary | ICD-10-CM | POA: Diagnosis not present

## 2016-09-25 DIAGNOSIS — Z6824 Body mass index (BMI) 24.0-24.9, adult: Secondary | ICD-10-CM | POA: Diagnosis not present

## 2016-09-25 DIAGNOSIS — S32010A Wedge compression fracture of first lumbar vertebra, initial encounter for closed fracture: Secondary | ICD-10-CM | POA: Diagnosis not present

## 2016-09-25 DIAGNOSIS — K7460 Unspecified cirrhosis of liver: Secondary | ICD-10-CM | POA: Diagnosis not present

## 2016-09-25 DIAGNOSIS — E1022 Type 1 diabetes mellitus with diabetic chronic kidney disease: Secondary | ICD-10-CM | POA: Diagnosis not present

## 2016-10-18 DIAGNOSIS — Z299 Encounter for prophylactic measures, unspecified: Secondary | ICD-10-CM | POA: Diagnosis not present

## 2016-10-18 DIAGNOSIS — N183 Chronic kidney disease, stage 3 (moderate): Secondary | ICD-10-CM | POA: Diagnosis not present

## 2016-10-18 DIAGNOSIS — E1022 Type 1 diabetes mellitus with diabetic chronic kidney disease: Secondary | ICD-10-CM | POA: Diagnosis not present

## 2016-10-18 DIAGNOSIS — E7800 Pure hypercholesterolemia, unspecified: Secondary | ICD-10-CM | POA: Diagnosis not present

## 2016-10-18 DIAGNOSIS — Z6823 Body mass index (BMI) 23.0-23.9, adult: Secondary | ICD-10-CM | POA: Diagnosis not present

## 2016-10-18 DIAGNOSIS — I10 Essential (primary) hypertension: Secondary | ICD-10-CM | POA: Diagnosis not present

## 2016-10-22 DIAGNOSIS — E119 Type 2 diabetes mellitus without complications: Secondary | ICD-10-CM | POA: Diagnosis not present

## 2016-11-27 DIAGNOSIS — Z299 Encounter for prophylactic measures, unspecified: Secondary | ICD-10-CM | POA: Diagnosis not present

## 2016-11-27 DIAGNOSIS — R5383 Other fatigue: Secondary | ICD-10-CM | POA: Diagnosis not present

## 2016-11-27 DIAGNOSIS — R109 Unspecified abdominal pain: Secondary | ICD-10-CM | POA: Diagnosis not present

## 2016-11-27 DIAGNOSIS — E1022 Type 1 diabetes mellitus with diabetic chronic kidney disease: Secondary | ICD-10-CM | POA: Diagnosis not present

## 2016-11-27 DIAGNOSIS — N183 Chronic kidney disease, stage 3 (moderate): Secondary | ICD-10-CM | POA: Diagnosis not present

## 2016-11-27 DIAGNOSIS — D696 Thrombocytopenia, unspecified: Secondary | ICD-10-CM | POA: Diagnosis not present

## 2016-11-27 DIAGNOSIS — I10 Essential (primary) hypertension: Secondary | ICD-10-CM | POA: Diagnosis not present

## 2016-12-03 DIAGNOSIS — R109 Unspecified abdominal pain: Secondary | ICD-10-CM | POA: Diagnosis not present

## 2016-12-03 DIAGNOSIS — Z9049 Acquired absence of other specified parts of digestive tract: Secondary | ICD-10-CM | POA: Diagnosis not present

## 2016-12-03 DIAGNOSIS — R1010 Upper abdominal pain, unspecified: Secondary | ICD-10-CM | POA: Diagnosis not present

## 2016-12-04 DIAGNOSIS — Z9049 Acquired absence of other specified parts of digestive tract: Secondary | ICD-10-CM | POA: Diagnosis not present

## 2016-12-04 DIAGNOSIS — R1010 Upper abdominal pain, unspecified: Secondary | ICD-10-CM | POA: Diagnosis not present

## 2016-12-07 DIAGNOSIS — E119 Type 2 diabetes mellitus without complications: Secondary | ICD-10-CM | POA: Diagnosis not present

## 2016-12-11 DIAGNOSIS — N183 Chronic kidney disease, stage 3 (moderate): Secondary | ICD-10-CM | POA: Diagnosis not present

## 2016-12-11 DIAGNOSIS — Z299 Encounter for prophylactic measures, unspecified: Secondary | ICD-10-CM | POA: Diagnosis not present

## 2016-12-11 DIAGNOSIS — E7800 Pure hypercholesterolemia, unspecified: Secondary | ICD-10-CM | POA: Diagnosis not present

## 2016-12-11 DIAGNOSIS — E1022 Type 1 diabetes mellitus with diabetic chronic kidney disease: Secondary | ICD-10-CM | POA: Diagnosis not present

## 2016-12-11 DIAGNOSIS — I10 Essential (primary) hypertension: Secondary | ICD-10-CM | POA: Diagnosis not present

## 2017-01-01 DIAGNOSIS — E119 Type 2 diabetes mellitus without complications: Secondary | ICD-10-CM | POA: Diagnosis not present

## 2017-02-01 DIAGNOSIS — E1122 Type 2 diabetes mellitus with diabetic chronic kidney disease: Secondary | ICD-10-CM | POA: Diagnosis not present

## 2017-02-01 DIAGNOSIS — Z789 Other specified health status: Secondary | ICD-10-CM | POA: Diagnosis not present

## 2017-02-01 DIAGNOSIS — K7460 Unspecified cirrhosis of liver: Secondary | ICD-10-CM | POA: Diagnosis not present

## 2017-02-01 DIAGNOSIS — D696 Thrombocytopenia, unspecified: Secondary | ICD-10-CM | POA: Diagnosis not present

## 2017-02-01 DIAGNOSIS — N183 Chronic kidney disease, stage 3 (moderate): Secondary | ICD-10-CM | POA: Diagnosis not present

## 2017-02-01 DIAGNOSIS — R350 Frequency of micturition: Secondary | ICD-10-CM | POA: Diagnosis not present

## 2017-02-01 DIAGNOSIS — Z6823 Body mass index (BMI) 23.0-23.9, adult: Secondary | ICD-10-CM | POA: Diagnosis not present

## 2017-02-01 DIAGNOSIS — I739 Peripheral vascular disease, unspecified: Secondary | ICD-10-CM | POA: Diagnosis not present

## 2017-02-01 DIAGNOSIS — I10 Essential (primary) hypertension: Secondary | ICD-10-CM | POA: Diagnosis not present

## 2017-02-01 DIAGNOSIS — Z299 Encounter for prophylactic measures, unspecified: Secondary | ICD-10-CM | POA: Diagnosis not present

## 2017-02-01 DIAGNOSIS — E7800 Pure hypercholesterolemia, unspecified: Secondary | ICD-10-CM | POA: Diagnosis not present

## 2017-02-01 DIAGNOSIS — E1142 Type 2 diabetes mellitus with diabetic polyneuropathy: Secondary | ICD-10-CM | POA: Diagnosis not present

## 2017-02-08 DIAGNOSIS — Z6823 Body mass index (BMI) 23.0-23.9, adult: Secondary | ICD-10-CM | POA: Diagnosis not present

## 2017-02-08 DIAGNOSIS — E1122 Type 2 diabetes mellitus with diabetic chronic kidney disease: Secondary | ICD-10-CM | POA: Diagnosis not present

## 2017-02-08 DIAGNOSIS — Z299 Encounter for prophylactic measures, unspecified: Secondary | ICD-10-CM | POA: Diagnosis not present

## 2017-02-08 DIAGNOSIS — Z713 Dietary counseling and surveillance: Secondary | ICD-10-CM | POA: Diagnosis not present

## 2017-02-08 DIAGNOSIS — K7460 Unspecified cirrhosis of liver: Secondary | ICD-10-CM | POA: Diagnosis not present

## 2017-02-08 DIAGNOSIS — Z789 Other specified health status: Secondary | ICD-10-CM | POA: Diagnosis not present

## 2017-02-08 DIAGNOSIS — L989 Disorder of the skin and subcutaneous tissue, unspecified: Secondary | ICD-10-CM | POA: Diagnosis not present

## 2017-02-08 DIAGNOSIS — I10 Essential (primary) hypertension: Secondary | ICD-10-CM | POA: Diagnosis not present

## 2017-02-22 DIAGNOSIS — Z299 Encounter for prophylactic measures, unspecified: Secondary | ICD-10-CM | POA: Diagnosis not present

## 2017-02-22 DIAGNOSIS — Z713 Dietary counseling and surveillance: Secondary | ICD-10-CM | POA: Diagnosis not present

## 2017-02-22 DIAGNOSIS — Z6822 Body mass index (BMI) 22.0-22.9, adult: Secondary | ICD-10-CM | POA: Diagnosis not present

## 2017-02-22 DIAGNOSIS — R21 Rash and other nonspecific skin eruption: Secondary | ICD-10-CM | POA: Diagnosis not present

## 2017-02-22 DIAGNOSIS — D0430 Carcinoma in situ of skin of unspecified part of face: Secondary | ICD-10-CM | POA: Diagnosis not present

## 2017-02-27 DIAGNOSIS — D0439 Carcinoma in situ of skin of other parts of face: Secondary | ICD-10-CM | POA: Diagnosis not present

## 2017-02-27 DIAGNOSIS — L989 Disorder of the skin and subcutaneous tissue, unspecified: Secondary | ICD-10-CM | POA: Diagnosis not present

## 2017-03-26 DIAGNOSIS — R21 Rash and other nonspecific skin eruption: Secondary | ICD-10-CM | POA: Diagnosis not present

## 2017-03-26 DIAGNOSIS — Z713 Dietary counseling and surveillance: Secondary | ICD-10-CM | POA: Diagnosis not present

## 2017-03-26 DIAGNOSIS — Z6824 Body mass index (BMI) 24.0-24.9, adult: Secondary | ICD-10-CM | POA: Diagnosis not present

## 2017-03-26 DIAGNOSIS — E1142 Type 2 diabetes mellitus with diabetic polyneuropathy: Secondary | ICD-10-CM | POA: Diagnosis not present

## 2017-03-26 DIAGNOSIS — N183 Chronic kidney disease, stage 3 (moderate): Secondary | ICD-10-CM | POA: Diagnosis not present

## 2017-03-26 DIAGNOSIS — E7800 Pure hypercholesterolemia, unspecified: Secondary | ICD-10-CM | POA: Diagnosis not present

## 2017-03-26 DIAGNOSIS — Z299 Encounter for prophylactic measures, unspecified: Secondary | ICD-10-CM | POA: Diagnosis not present

## 2017-03-26 DIAGNOSIS — I10 Essential (primary) hypertension: Secondary | ICD-10-CM | POA: Diagnosis not present

## 2017-03-26 DIAGNOSIS — E1122 Type 2 diabetes mellitus with diabetic chronic kidney disease: Secondary | ICD-10-CM | POA: Diagnosis not present

## 2017-03-26 DIAGNOSIS — G4700 Insomnia, unspecified: Secondary | ICD-10-CM | POA: Diagnosis not present

## 2017-03-26 DIAGNOSIS — R609 Edema, unspecified: Secondary | ICD-10-CM | POA: Diagnosis not present

## 2017-04-01 DIAGNOSIS — D0439 Carcinoma in situ of skin of other parts of face: Secondary | ICD-10-CM | POA: Diagnosis not present

## 2017-04-01 DIAGNOSIS — C44319 Basal cell carcinoma of skin of other parts of face: Secondary | ICD-10-CM | POA: Diagnosis not present

## 2017-04-10 DIAGNOSIS — E119 Type 2 diabetes mellitus without complications: Secondary | ICD-10-CM | POA: Diagnosis not present

## 2017-04-30 DIAGNOSIS — X32XXXD Exposure to sunlight, subsequent encounter: Secondary | ICD-10-CM | POA: Diagnosis not present

## 2017-04-30 DIAGNOSIS — Z08 Encounter for follow-up examination after completed treatment for malignant neoplasm: Secondary | ICD-10-CM | POA: Diagnosis not present

## 2017-04-30 DIAGNOSIS — L570 Actinic keratosis: Secondary | ICD-10-CM | POA: Diagnosis not present

## 2017-04-30 DIAGNOSIS — Z85828 Personal history of other malignant neoplasm of skin: Secondary | ICD-10-CM | POA: Diagnosis not present

## 2017-05-07 DIAGNOSIS — E119 Type 2 diabetes mellitus without complications: Secondary | ICD-10-CM | POA: Diagnosis not present

## 2017-05-14 DIAGNOSIS — X32XXXD Exposure to sunlight, subsequent encounter: Secondary | ICD-10-CM | POA: Diagnosis not present

## 2017-05-14 DIAGNOSIS — L820 Inflamed seborrheic keratosis: Secondary | ICD-10-CM | POA: Diagnosis not present

## 2017-05-14 DIAGNOSIS — L570 Actinic keratosis: Secondary | ICD-10-CM | POA: Diagnosis not present

## 2017-05-14 DIAGNOSIS — I872 Venous insufficiency (chronic) (peripheral): Secondary | ICD-10-CM | POA: Diagnosis not present

## 2017-05-27 DIAGNOSIS — E119 Type 2 diabetes mellitus without complications: Secondary | ICD-10-CM | POA: Diagnosis not present

## 2017-06-21 DIAGNOSIS — D509 Iron deficiency anemia, unspecified: Secondary | ICD-10-CM | POA: Diagnosis not present

## 2017-06-21 DIAGNOSIS — Z6825 Body mass index (BMI) 25.0-25.9, adult: Secondary | ICD-10-CM | POA: Diagnosis not present

## 2017-06-21 DIAGNOSIS — K219 Gastro-esophageal reflux disease without esophagitis: Secondary | ICD-10-CM | POA: Diagnosis not present

## 2017-06-21 DIAGNOSIS — E1165 Type 2 diabetes mellitus with hyperglycemia: Secondary | ICD-10-CM | POA: Diagnosis not present

## 2017-06-21 DIAGNOSIS — S40012A Contusion of left shoulder, initial encounter: Secondary | ICD-10-CM | POA: Diagnosis not present

## 2017-06-21 DIAGNOSIS — I10 Essential (primary) hypertension: Secondary | ICD-10-CM | POA: Diagnosis not present

## 2017-06-21 DIAGNOSIS — D519 Vitamin B12 deficiency anemia, unspecified: Secondary | ICD-10-CM | POA: Diagnosis not present

## 2017-06-21 DIAGNOSIS — K7581 Nonalcoholic steatohepatitis (NASH): Secondary | ICD-10-CM | POA: Diagnosis not present

## 2017-06-21 DIAGNOSIS — E782 Mixed hyperlipidemia: Secondary | ICD-10-CM | POA: Diagnosis not present

## 2017-06-21 DIAGNOSIS — D529 Folate deficiency anemia, unspecified: Secondary | ICD-10-CM | POA: Diagnosis not present

## 2017-06-21 DIAGNOSIS — S42212A Unspecified displaced fracture of surgical neck of left humerus, initial encounter for closed fracture: Secondary | ICD-10-CM | POA: Diagnosis not present

## 2017-06-22 DIAGNOSIS — D509 Iron deficiency anemia, unspecified: Secondary | ICD-10-CM | POA: Diagnosis not present

## 2017-06-27 DIAGNOSIS — D500 Iron deficiency anemia secondary to blood loss (chronic): Secondary | ICD-10-CM | POA: Diagnosis not present

## 2017-06-27 DIAGNOSIS — Z86010 Personal history of colonic polyps: Secondary | ICD-10-CM | POA: Diagnosis not present

## 2017-06-27 DIAGNOSIS — K625 Hemorrhage of anus and rectum: Secondary | ICD-10-CM | POA: Diagnosis not present

## 2017-07-09 DIAGNOSIS — K7460 Unspecified cirrhosis of liver: Secondary | ICD-10-CM | POA: Diagnosis not present

## 2017-07-09 DIAGNOSIS — Z79899 Other long term (current) drug therapy: Secondary | ICD-10-CM | POA: Diagnosis not present

## 2017-07-09 DIAGNOSIS — K625 Hemorrhage of anus and rectum: Secondary | ICD-10-CM | POA: Diagnosis not present

## 2017-07-09 DIAGNOSIS — M1990 Unspecified osteoarthritis, unspecified site: Secondary | ICD-10-CM | POA: Diagnosis not present

## 2017-07-09 DIAGNOSIS — Z9049 Acquired absence of other specified parts of digestive tract: Secondary | ICD-10-CM | POA: Diagnosis not present

## 2017-07-09 DIAGNOSIS — Z818 Family history of other mental and behavioral disorders: Secondary | ICD-10-CM | POA: Diagnosis not present

## 2017-07-09 DIAGNOSIS — D500 Iron deficiency anemia secondary to blood loss (chronic): Secondary | ICD-10-CM | POA: Diagnosis not present

## 2017-07-09 DIAGNOSIS — Z86010 Personal history of colonic polyps: Secondary | ICD-10-CM | POA: Diagnosis not present

## 2017-07-09 DIAGNOSIS — I10 Essential (primary) hypertension: Secondary | ICD-10-CM | POA: Diagnosis not present

## 2017-07-09 DIAGNOSIS — E119 Type 2 diabetes mellitus without complications: Secondary | ICD-10-CM | POA: Diagnosis not present

## 2017-07-09 DIAGNOSIS — K219 Gastro-esophageal reflux disease without esophagitis: Secondary | ICD-10-CM | POA: Diagnosis not present

## 2017-07-09 DIAGNOSIS — Z9851 Tubal ligation status: Secondary | ICD-10-CM | POA: Diagnosis not present

## 2017-07-09 DIAGNOSIS — Z7984 Long term (current) use of oral hypoglycemic drugs: Secondary | ICD-10-CM | POA: Diagnosis not present

## 2017-07-29 DIAGNOSIS — Z23 Encounter for immunization: Secondary | ICD-10-CM | POA: Diagnosis not present

## 2017-07-29 DIAGNOSIS — S42212A Unspecified displaced fracture of surgical neck of left humerus, initial encounter for closed fracture: Secondary | ICD-10-CM | POA: Diagnosis not present

## 2017-07-29 DIAGNOSIS — S40012A Contusion of left shoulder, initial encounter: Secondary | ICD-10-CM | POA: Diagnosis not present

## 2017-07-29 DIAGNOSIS — M109 Gout, unspecified: Secondary | ICD-10-CM | POA: Diagnosis not present

## 2017-07-29 DIAGNOSIS — E1165 Type 2 diabetes mellitus with hyperglycemia: Secondary | ICD-10-CM | POA: Diagnosis not present

## 2017-07-29 DIAGNOSIS — K219 Gastro-esophageal reflux disease without esophagitis: Secondary | ICD-10-CM | POA: Diagnosis not present

## 2017-07-29 DIAGNOSIS — K7581 Nonalcoholic steatohepatitis (NASH): Secondary | ICD-10-CM | POA: Diagnosis not present

## 2017-07-29 DIAGNOSIS — I10 Essential (primary) hypertension: Secondary | ICD-10-CM | POA: Diagnosis not present

## 2017-07-29 DIAGNOSIS — Z6823 Body mass index (BMI) 23.0-23.9, adult: Secondary | ICD-10-CM | POA: Diagnosis not present

## 2017-07-29 DIAGNOSIS — E782 Mixed hyperlipidemia: Secondary | ICD-10-CM | POA: Diagnosis not present

## 2017-07-29 DIAGNOSIS — D519 Vitamin B12 deficiency anemia, unspecified: Secondary | ICD-10-CM | POA: Diagnosis not present

## 2017-09-23 DIAGNOSIS — M810 Age-related osteoporosis without current pathological fracture: Secondary | ICD-10-CM | POA: Diagnosis not present

## 2017-10-25 DIAGNOSIS — K219 Gastro-esophageal reflux disease without esophagitis: Secondary | ICD-10-CM | POA: Diagnosis not present

## 2017-10-25 DIAGNOSIS — I10 Essential (primary) hypertension: Secondary | ICD-10-CM | POA: Diagnosis not present

## 2017-10-25 DIAGNOSIS — K7581 Nonalcoholic steatohepatitis (NASH): Secondary | ICD-10-CM | POA: Diagnosis not present

## 2017-10-25 DIAGNOSIS — E782 Mixed hyperlipidemia: Secondary | ICD-10-CM | POA: Diagnosis not present

## 2017-10-25 DIAGNOSIS — E1165 Type 2 diabetes mellitus with hyperglycemia: Secondary | ICD-10-CM | POA: Diagnosis not present

## 2017-10-25 DIAGNOSIS — D509 Iron deficiency anemia, unspecified: Secondary | ICD-10-CM | POA: Diagnosis not present

## 2017-10-25 DIAGNOSIS — D519 Vitamin B12 deficiency anemia, unspecified: Secondary | ICD-10-CM | POA: Diagnosis not present

## 2017-10-25 DIAGNOSIS — D529 Folate deficiency anemia, unspecified: Secondary | ICD-10-CM | POA: Diagnosis not present

## 2017-10-27 DIAGNOSIS — R079 Chest pain, unspecified: Secondary | ICD-10-CM | POA: Diagnosis not present

## 2017-10-27 DIAGNOSIS — M545 Low back pain: Secondary | ICD-10-CM | POA: Diagnosis not present

## 2017-10-27 DIAGNOSIS — M79604 Pain in right leg: Secondary | ICD-10-CM | POA: Diagnosis not present

## 2017-10-27 DIAGNOSIS — D649 Anemia, unspecified: Secondary | ICD-10-CM | POA: Diagnosis not present

## 2017-10-27 DIAGNOSIS — I129 Hypertensive chronic kidney disease with stage 1 through stage 4 chronic kidney disease, or unspecified chronic kidney disease: Secondary | ICD-10-CM | POA: Diagnosis not present

## 2017-10-27 DIAGNOSIS — M25551 Pain in right hip: Secondary | ICD-10-CM | POA: Diagnosis not present

## 2017-10-27 DIAGNOSIS — D61818 Other pancytopenia: Secondary | ICD-10-CM | POA: Diagnosis not present

## 2017-10-27 DIAGNOSIS — G549 Nerve root and plexus disorder, unspecified: Secondary | ICD-10-CM | POA: Diagnosis not present

## 2017-10-27 DIAGNOSIS — M1611 Unilateral primary osteoarthritis, right hip: Secondary | ICD-10-CM | POA: Diagnosis not present

## 2017-10-27 DIAGNOSIS — M79651 Pain in right thigh: Secondary | ICD-10-CM | POA: Diagnosis not present

## 2017-10-27 DIAGNOSIS — D500 Iron deficiency anemia secondary to blood loss (chronic): Secondary | ICD-10-CM | POA: Diagnosis not present

## 2017-10-28 DIAGNOSIS — Z79899 Other long term (current) drug therapy: Secondary | ICD-10-CM | POA: Diagnosis not present

## 2017-10-28 DIAGNOSIS — M1611 Unilateral primary osteoarthritis, right hip: Secondary | ICD-10-CM | POA: Diagnosis present

## 2017-10-28 DIAGNOSIS — Z833 Family history of diabetes mellitus: Secondary | ICD-10-CM | POA: Diagnosis not present

## 2017-10-28 DIAGNOSIS — D61818 Other pancytopenia: Secondary | ICD-10-CM | POA: Diagnosis present

## 2017-10-28 DIAGNOSIS — E1122 Type 2 diabetes mellitus with diabetic chronic kidney disease: Secondary | ICD-10-CM | POA: Diagnosis present

## 2017-10-28 DIAGNOSIS — M545 Low back pain: Secondary | ICD-10-CM | POA: Diagnosis present

## 2017-10-28 DIAGNOSIS — G549 Nerve root and plexus disorder, unspecified: Secondary | ICD-10-CM | POA: Diagnosis present

## 2017-10-28 DIAGNOSIS — M1990 Unspecified osteoarthritis, unspecified site: Secondary | ICD-10-CM | POA: Diagnosis present

## 2017-10-28 DIAGNOSIS — I129 Hypertensive chronic kidney disease with stage 1 through stage 4 chronic kidney disease, or unspecified chronic kidney disease: Secondary | ICD-10-CM | POA: Diagnosis present

## 2017-10-28 DIAGNOSIS — Z7984 Long term (current) use of oral hypoglycemic drugs: Secondary | ICD-10-CM | POA: Diagnosis not present

## 2017-10-28 DIAGNOSIS — K219 Gastro-esophageal reflux disease without esophagitis: Secondary | ICD-10-CM | POA: Diagnosis present

## 2017-10-28 DIAGNOSIS — K7460 Unspecified cirrhosis of liver: Secondary | ICD-10-CM | POA: Diagnosis present

## 2017-10-28 DIAGNOSIS — Z23 Encounter for immunization: Secondary | ICD-10-CM | POA: Diagnosis not present

## 2017-10-28 DIAGNOSIS — D500 Iron deficiency anemia secondary to blood loss (chronic): Secondary | ICD-10-CM | POA: Diagnosis present

## 2017-10-28 DIAGNOSIS — Z818 Family history of other mental and behavioral disorders: Secondary | ICD-10-CM | POA: Diagnosis not present

## 2017-10-28 DIAGNOSIS — Z8249 Family history of ischemic heart disease and other diseases of the circulatory system: Secondary | ICD-10-CM | POA: Diagnosis not present

## 2017-10-28 DIAGNOSIS — N183 Chronic kidney disease, stage 3 (moderate): Secondary | ICD-10-CM | POA: Diagnosis present

## 2017-10-31 DIAGNOSIS — Z8781 Personal history of (healed) traumatic fracture: Secondary | ICD-10-CM | POA: Diagnosis not present

## 2017-10-31 DIAGNOSIS — I129 Hypertensive chronic kidney disease with stage 1 through stage 4 chronic kidney disease, or unspecified chronic kidney disease: Secondary | ICD-10-CM | POA: Diagnosis not present

## 2017-10-31 DIAGNOSIS — M1611 Unilateral primary osteoarthritis, right hip: Secondary | ICD-10-CM | POA: Diagnosis not present

## 2017-10-31 DIAGNOSIS — M810 Age-related osteoporosis without current pathological fracture: Secondary | ICD-10-CM | POA: Diagnosis not present

## 2017-10-31 DIAGNOSIS — K219 Gastro-esophageal reflux disease without esophagitis: Secondary | ICD-10-CM | POA: Diagnosis not present

## 2017-10-31 DIAGNOSIS — D509 Iron deficiency anemia, unspecified: Secondary | ICD-10-CM | POA: Diagnosis not present

## 2017-10-31 DIAGNOSIS — E1122 Type 2 diabetes mellitus with diabetic chronic kidney disease: Secondary | ICD-10-CM | POA: Diagnosis not present

## 2017-10-31 DIAGNOSIS — D61818 Other pancytopenia: Secondary | ICD-10-CM | POA: Diagnosis not present

## 2017-10-31 DIAGNOSIS — N183 Chronic kidney disease, stage 3 (moderate): Secondary | ICD-10-CM | POA: Diagnosis not present

## 2017-11-04 DIAGNOSIS — E1122 Type 2 diabetes mellitus with diabetic chronic kidney disease: Secondary | ICD-10-CM | POA: Diagnosis not present

## 2017-11-04 DIAGNOSIS — D61818 Other pancytopenia: Secondary | ICD-10-CM | POA: Diagnosis not present

## 2017-11-04 DIAGNOSIS — I129 Hypertensive chronic kidney disease with stage 1 through stage 4 chronic kidney disease, or unspecified chronic kidney disease: Secondary | ICD-10-CM | POA: Diagnosis not present

## 2017-11-04 DIAGNOSIS — Z6825 Body mass index (BMI) 25.0-25.9, adult: Secondary | ICD-10-CM | POA: Diagnosis not present

## 2017-11-04 DIAGNOSIS — D519 Vitamin B12 deficiency anemia, unspecified: Secondary | ICD-10-CM | POA: Diagnosis not present

## 2017-11-04 DIAGNOSIS — D509 Iron deficiency anemia, unspecified: Secondary | ICD-10-CM | POA: Diagnosis not present

## 2017-11-04 DIAGNOSIS — D529 Folate deficiency anemia, unspecified: Secondary | ICD-10-CM | POA: Diagnosis not present

## 2017-11-04 DIAGNOSIS — N183 Chronic kidney disease, stage 3 (moderate): Secondary | ICD-10-CM | POA: Diagnosis not present

## 2017-11-04 DIAGNOSIS — M1611 Unilateral primary osteoarthritis, right hip: Secondary | ICD-10-CM | POA: Diagnosis not present

## 2017-11-04 DIAGNOSIS — M545 Low back pain: Secondary | ICD-10-CM | POA: Diagnosis not present

## 2017-11-06 DIAGNOSIS — E1122 Type 2 diabetes mellitus with diabetic chronic kidney disease: Secondary | ICD-10-CM | POA: Diagnosis not present

## 2017-11-06 DIAGNOSIS — N183 Chronic kidney disease, stage 3 (moderate): Secondary | ICD-10-CM | POA: Diagnosis not present

## 2017-11-06 DIAGNOSIS — D61818 Other pancytopenia: Secondary | ICD-10-CM | POA: Diagnosis not present

## 2017-11-06 DIAGNOSIS — M1611 Unilateral primary osteoarthritis, right hip: Secondary | ICD-10-CM | POA: Diagnosis not present

## 2017-11-06 DIAGNOSIS — D509 Iron deficiency anemia, unspecified: Secondary | ICD-10-CM | POA: Diagnosis not present

## 2017-11-06 DIAGNOSIS — I129 Hypertensive chronic kidney disease with stage 1 through stage 4 chronic kidney disease, or unspecified chronic kidney disease: Secondary | ICD-10-CM | POA: Diagnosis not present

## 2017-11-11 DIAGNOSIS — M1611 Unilateral primary osteoarthritis, right hip: Secondary | ICD-10-CM | POA: Diagnosis not present

## 2017-11-11 DIAGNOSIS — N183 Chronic kidney disease, stage 3 (moderate): Secondary | ICD-10-CM | POA: Diagnosis not present

## 2017-11-11 DIAGNOSIS — D509 Iron deficiency anemia, unspecified: Secondary | ICD-10-CM | POA: Diagnosis not present

## 2017-11-11 DIAGNOSIS — D61818 Other pancytopenia: Secondary | ICD-10-CM | POA: Diagnosis not present

## 2017-11-11 DIAGNOSIS — I129 Hypertensive chronic kidney disease with stage 1 through stage 4 chronic kidney disease, or unspecified chronic kidney disease: Secondary | ICD-10-CM | POA: Diagnosis not present

## 2017-11-11 DIAGNOSIS — E1122 Type 2 diabetes mellitus with diabetic chronic kidney disease: Secondary | ICD-10-CM | POA: Diagnosis not present

## 2017-11-13 DIAGNOSIS — N183 Chronic kidney disease, stage 3 (moderate): Secondary | ICD-10-CM | POA: Diagnosis present

## 2017-11-13 DIAGNOSIS — D62 Acute posthemorrhagic anemia: Secondary | ICD-10-CM | POA: Diagnosis not present

## 2017-11-13 DIAGNOSIS — Z818 Family history of other mental and behavioral disorders: Secondary | ICD-10-CM | POA: Diagnosis not present

## 2017-11-13 DIAGNOSIS — Z79899 Other long term (current) drug therapy: Secondary | ICD-10-CM | POA: Diagnosis not present

## 2017-11-13 DIAGNOSIS — E1122 Type 2 diabetes mellitus with diabetic chronic kidney disease: Secondary | ICD-10-CM | POA: Diagnosis present

## 2017-11-13 DIAGNOSIS — R531 Weakness: Secondary | ICD-10-CM | POA: Diagnosis not present

## 2017-11-13 DIAGNOSIS — R404 Transient alteration of awareness: Secondary | ICD-10-CM | POA: Diagnosis not present

## 2017-11-13 DIAGNOSIS — M6281 Muscle weakness (generalized): Secondary | ICD-10-CM | POA: Diagnosis not present

## 2017-11-13 DIAGNOSIS — R600 Localized edema: Secondary | ICD-10-CM | POA: Diagnosis present

## 2017-11-13 DIAGNOSIS — S72051A Unspecified fracture of head of right femur, initial encounter for closed fracture: Secondary | ICD-10-CM | POA: Diagnosis not present

## 2017-11-13 DIAGNOSIS — R279 Unspecified lack of coordination: Secondary | ICD-10-CM | POA: Diagnosis not present

## 2017-11-13 DIAGNOSIS — D696 Thrombocytopenia, unspecified: Secondary | ICD-10-CM | POA: Diagnosis not present

## 2017-11-13 DIAGNOSIS — S72001A Fracture of unspecified part of neck of right femur, initial encounter for closed fracture: Secondary | ICD-10-CM | POA: Diagnosis not present

## 2017-11-13 DIAGNOSIS — Z96641 Presence of right artificial hip joint: Secondary | ICD-10-CM | POA: Diagnosis not present

## 2017-11-13 DIAGNOSIS — Z8249 Family history of ischemic heart disease and other diseases of the circulatory system: Secondary | ICD-10-CM | POA: Diagnosis not present

## 2017-11-13 DIAGNOSIS — Z7401 Bed confinement status: Secondary | ICD-10-CM | POA: Diagnosis not present

## 2017-11-13 DIAGNOSIS — S72011A Unspecified intracapsular fracture of right femur, initial encounter for closed fracture: Secondary | ICD-10-CM | POA: Diagnosis not present

## 2017-11-13 DIAGNOSIS — K219 Gastro-esophageal reflux disease without esophagitis: Secondary | ICD-10-CM | POA: Diagnosis present

## 2017-11-13 DIAGNOSIS — R2689 Other abnormalities of gait and mobility: Secondary | ICD-10-CM | POA: Diagnosis not present

## 2017-11-13 DIAGNOSIS — M1990 Unspecified osteoarthritis, unspecified site: Secondary | ICD-10-CM | POA: Diagnosis present

## 2017-11-13 DIAGNOSIS — D509 Iron deficiency anemia, unspecified: Secondary | ICD-10-CM | POA: Diagnosis not present

## 2017-11-13 DIAGNOSIS — M810 Age-related osteoporosis without current pathological fracture: Secondary | ICD-10-CM | POA: Diagnosis not present

## 2017-11-13 DIAGNOSIS — M1611 Unilateral primary osteoarthritis, right hip: Secondary | ICD-10-CM | POA: Diagnosis not present

## 2017-11-13 DIAGNOSIS — R609 Edema, unspecified: Secondary | ICD-10-CM | POA: Diagnosis not present

## 2017-11-13 DIAGNOSIS — I129 Hypertensive chronic kidney disease with stage 1 through stage 4 chronic kidney disease, or unspecified chronic kidney disease: Secondary | ICD-10-CM | POA: Diagnosis present

## 2017-11-13 DIAGNOSIS — E871 Hypo-osmolality and hyponatremia: Secondary | ICD-10-CM | POA: Diagnosis not present

## 2017-11-13 DIAGNOSIS — M80051A Age-related osteoporosis with current pathological fracture, right femur, initial encounter for fracture: Secondary | ICD-10-CM | POA: Diagnosis present

## 2017-11-13 DIAGNOSIS — S7291XA Unspecified fracture of right femur, initial encounter for closed fracture: Secondary | ICD-10-CM | POA: Diagnosis not present

## 2017-11-13 DIAGNOSIS — M79661 Pain in right lower leg: Secondary | ICD-10-CM | POA: Diagnosis not present

## 2017-11-13 DIAGNOSIS — Z471 Aftercare following joint replacement surgery: Secondary | ICD-10-CM | POA: Diagnosis not present

## 2017-11-13 DIAGNOSIS — M25551 Pain in right hip: Secondary | ICD-10-CM | POA: Diagnosis not present

## 2017-11-13 DIAGNOSIS — D61818 Other pancytopenia: Secondary | ICD-10-CM | POA: Diagnosis not present

## 2017-11-19 DIAGNOSIS — N183 Chronic kidney disease, stage 3 (moderate): Secondary | ICD-10-CM | POA: Diagnosis not present

## 2017-11-19 DIAGNOSIS — Z7401 Bed confinement status: Secondary | ICD-10-CM | POA: Diagnosis not present

## 2017-11-19 DIAGNOSIS — K219 Gastro-esophageal reflux disease without esophagitis: Secondary | ICD-10-CM | POA: Diagnosis not present

## 2017-11-19 DIAGNOSIS — Z6825 Body mass index (BMI) 25.0-25.9, adult: Secondary | ICD-10-CM | POA: Diagnosis not present

## 2017-11-19 DIAGNOSIS — D61818 Other pancytopenia: Secondary | ICD-10-CM | POA: Diagnosis not present

## 2017-11-19 DIAGNOSIS — M80051A Age-related osteoporosis with current pathological fracture, right femur, initial encounter for fracture: Secondary | ICD-10-CM | POA: Diagnosis not present

## 2017-11-19 DIAGNOSIS — S72001D Fracture of unspecified part of neck of right femur, subsequent encounter for closed fracture with routine healing: Secondary | ICD-10-CM | POA: Diagnosis not present

## 2017-11-19 DIAGNOSIS — M810 Age-related osteoporosis without current pathological fracture: Secondary | ICD-10-CM | POA: Diagnosis not present

## 2017-11-19 DIAGNOSIS — M6281 Muscle weakness (generalized): Secondary | ICD-10-CM | POA: Diagnosis not present

## 2017-11-19 DIAGNOSIS — R2689 Other abnormalities of gait and mobility: Secondary | ICD-10-CM | POA: Diagnosis not present

## 2017-11-19 DIAGNOSIS — I129 Hypertensive chronic kidney disease with stage 1 through stage 4 chronic kidney disease, or unspecified chronic kidney disease: Secondary | ICD-10-CM | POA: Diagnosis not present

## 2017-11-19 DIAGNOSIS — Z471 Aftercare following joint replacement surgery: Secondary | ICD-10-CM | POA: Diagnosis not present

## 2017-11-19 DIAGNOSIS — Z96641 Presence of right artificial hip joint: Secondary | ICD-10-CM | POA: Diagnosis not present

## 2017-11-19 DIAGNOSIS — R600 Localized edema: Secondary | ICD-10-CM | POA: Diagnosis not present

## 2017-11-19 DIAGNOSIS — R279 Unspecified lack of coordination: Secondary | ICD-10-CM | POA: Diagnosis not present

## 2017-11-19 DIAGNOSIS — M545 Low back pain: Secondary | ICD-10-CM | POA: Diagnosis not present

## 2017-11-19 DIAGNOSIS — E1122 Type 2 diabetes mellitus with diabetic chronic kidney disease: Secondary | ICD-10-CM | POA: Diagnosis not present

## 2017-11-19 DIAGNOSIS — D509 Iron deficiency anemia, unspecified: Secondary | ICD-10-CM | POA: Diagnosis not present

## 2017-11-24 DIAGNOSIS — E1122 Type 2 diabetes mellitus with diabetic chronic kidney disease: Secondary | ICD-10-CM | POA: Diagnosis not present

## 2017-11-29 DIAGNOSIS — Z6825 Body mass index (BMI) 25.0-25.9, adult: Secondary | ICD-10-CM | POA: Diagnosis not present

## 2017-11-29 DIAGNOSIS — M545 Low back pain: Secondary | ICD-10-CM | POA: Diagnosis not present

## 2017-11-29 DIAGNOSIS — D61818 Other pancytopenia: Secondary | ICD-10-CM | POA: Diagnosis not present

## 2017-11-29 DIAGNOSIS — E1122 Type 2 diabetes mellitus with diabetic chronic kidney disease: Secondary | ICD-10-CM | POA: Diagnosis not present

## 2017-12-04 DIAGNOSIS — S72001D Fracture of unspecified part of neck of right femur, subsequent encounter for closed fracture with routine healing: Secondary | ICD-10-CM | POA: Diagnosis not present

## 2017-12-08 DIAGNOSIS — N183 Chronic kidney disease, stage 3 (moderate): Secondary | ICD-10-CM | POA: Diagnosis not present

## 2017-12-08 DIAGNOSIS — D61818 Other pancytopenia: Secondary | ICD-10-CM | POA: Diagnosis not present

## 2017-12-08 DIAGNOSIS — I129 Hypertensive chronic kidney disease with stage 1 through stage 4 chronic kidney disease, or unspecified chronic kidney disease: Secondary | ICD-10-CM | POA: Diagnosis not present

## 2017-12-08 DIAGNOSIS — D509 Iron deficiency anemia, unspecified: Secondary | ICD-10-CM | POA: Diagnosis not present

## 2017-12-08 DIAGNOSIS — E1122 Type 2 diabetes mellitus with diabetic chronic kidney disease: Secondary | ICD-10-CM | POA: Diagnosis not present

## 2017-12-08 DIAGNOSIS — M1611 Unilateral primary osteoarthritis, right hip: Secondary | ICD-10-CM | POA: Diagnosis not present

## 2017-12-12 DIAGNOSIS — D509 Iron deficiency anemia, unspecified: Secondary | ICD-10-CM | POA: Diagnosis not present

## 2017-12-12 DIAGNOSIS — I129 Hypertensive chronic kidney disease with stage 1 through stage 4 chronic kidney disease, or unspecified chronic kidney disease: Secondary | ICD-10-CM | POA: Diagnosis not present

## 2017-12-12 DIAGNOSIS — M1611 Unilateral primary osteoarthritis, right hip: Secondary | ICD-10-CM | POA: Diagnosis not present

## 2017-12-12 DIAGNOSIS — D61818 Other pancytopenia: Secondary | ICD-10-CM | POA: Diagnosis not present

## 2017-12-12 DIAGNOSIS — N183 Chronic kidney disease, stage 3 (moderate): Secondary | ICD-10-CM | POA: Diagnosis not present

## 2017-12-12 DIAGNOSIS — E1122 Type 2 diabetes mellitus with diabetic chronic kidney disease: Secondary | ICD-10-CM | POA: Diagnosis not present

## 2017-12-16 DIAGNOSIS — D509 Iron deficiency anemia, unspecified: Secondary | ICD-10-CM | POA: Diagnosis not present

## 2017-12-16 DIAGNOSIS — E1122 Type 2 diabetes mellitus with diabetic chronic kidney disease: Secondary | ICD-10-CM | POA: Diagnosis not present

## 2017-12-16 DIAGNOSIS — N183 Chronic kidney disease, stage 3 (moderate): Secondary | ICD-10-CM | POA: Diagnosis not present

## 2017-12-16 DIAGNOSIS — M1611 Unilateral primary osteoarthritis, right hip: Secondary | ICD-10-CM | POA: Diagnosis not present

## 2017-12-16 DIAGNOSIS — D61818 Other pancytopenia: Secondary | ICD-10-CM | POA: Diagnosis not present

## 2017-12-16 DIAGNOSIS — I129 Hypertensive chronic kidney disease with stage 1 through stage 4 chronic kidney disease, or unspecified chronic kidney disease: Secondary | ICD-10-CM | POA: Diagnosis not present

## 2017-12-18 DIAGNOSIS — E1122 Type 2 diabetes mellitus with diabetic chronic kidney disease: Secondary | ICD-10-CM | POA: Diagnosis not present

## 2017-12-18 DIAGNOSIS — M1611 Unilateral primary osteoarthritis, right hip: Secondary | ICD-10-CM | POA: Diagnosis not present

## 2017-12-18 DIAGNOSIS — N183 Chronic kidney disease, stage 3 (moderate): Secondary | ICD-10-CM | POA: Diagnosis not present

## 2017-12-18 DIAGNOSIS — D509 Iron deficiency anemia, unspecified: Secondary | ICD-10-CM | POA: Diagnosis not present

## 2017-12-18 DIAGNOSIS — D61818 Other pancytopenia: Secondary | ICD-10-CM | POA: Diagnosis not present

## 2017-12-18 DIAGNOSIS — I129 Hypertensive chronic kidney disease with stage 1 through stage 4 chronic kidney disease, or unspecified chronic kidney disease: Secondary | ICD-10-CM | POA: Diagnosis not present

## 2017-12-19 DIAGNOSIS — M1611 Unilateral primary osteoarthritis, right hip: Secondary | ICD-10-CM | POA: Diagnosis not present

## 2017-12-19 DIAGNOSIS — N183 Chronic kidney disease, stage 3 (moderate): Secondary | ICD-10-CM | POA: Diagnosis not present

## 2017-12-19 DIAGNOSIS — D509 Iron deficiency anemia, unspecified: Secondary | ICD-10-CM | POA: Diagnosis not present

## 2017-12-19 DIAGNOSIS — I129 Hypertensive chronic kidney disease with stage 1 through stage 4 chronic kidney disease, or unspecified chronic kidney disease: Secondary | ICD-10-CM | POA: Diagnosis not present

## 2017-12-19 DIAGNOSIS — D61818 Other pancytopenia: Secondary | ICD-10-CM | POA: Diagnosis not present

## 2017-12-19 DIAGNOSIS — E1122 Type 2 diabetes mellitus with diabetic chronic kidney disease: Secondary | ICD-10-CM | POA: Diagnosis not present

## 2017-12-24 DIAGNOSIS — M1611 Unilateral primary osteoarthritis, right hip: Secondary | ICD-10-CM | POA: Diagnosis not present

## 2017-12-24 DIAGNOSIS — E1165 Type 2 diabetes mellitus with hyperglycemia: Secondary | ICD-10-CM | POA: Diagnosis not present

## 2017-12-24 DIAGNOSIS — I10 Essential (primary) hypertension: Secondary | ICD-10-CM | POA: Diagnosis not present

## 2017-12-24 DIAGNOSIS — N183 Chronic kidney disease, stage 3 (moderate): Secondary | ICD-10-CM | POA: Diagnosis not present

## 2017-12-24 DIAGNOSIS — D509 Iron deficiency anemia, unspecified: Secondary | ICD-10-CM | POA: Diagnosis not present

## 2017-12-24 DIAGNOSIS — E782 Mixed hyperlipidemia: Secondary | ICD-10-CM | POA: Diagnosis not present

## 2017-12-24 DIAGNOSIS — E1122 Type 2 diabetes mellitus with diabetic chronic kidney disease: Secondary | ICD-10-CM | POA: Diagnosis not present

## 2017-12-24 DIAGNOSIS — D61818 Other pancytopenia: Secondary | ICD-10-CM | POA: Diagnosis not present

## 2017-12-24 DIAGNOSIS — K219 Gastro-esophageal reflux disease without esophagitis: Secondary | ICD-10-CM | POA: Diagnosis not present

## 2017-12-24 DIAGNOSIS — I129 Hypertensive chronic kidney disease with stage 1 through stage 4 chronic kidney disease, or unspecified chronic kidney disease: Secondary | ICD-10-CM | POA: Diagnosis not present

## 2017-12-25 DIAGNOSIS — E1122 Type 2 diabetes mellitus with diabetic chronic kidney disease: Secondary | ICD-10-CM | POA: Diagnosis not present

## 2017-12-25 DIAGNOSIS — I129 Hypertensive chronic kidney disease with stage 1 through stage 4 chronic kidney disease, or unspecified chronic kidney disease: Secondary | ICD-10-CM | POA: Diagnosis not present

## 2017-12-25 DIAGNOSIS — D509 Iron deficiency anemia, unspecified: Secondary | ICD-10-CM | POA: Diagnosis not present

## 2017-12-25 DIAGNOSIS — D61818 Other pancytopenia: Secondary | ICD-10-CM | POA: Diagnosis not present

## 2017-12-25 DIAGNOSIS — M1611 Unilateral primary osteoarthritis, right hip: Secondary | ICD-10-CM | POA: Diagnosis not present

## 2017-12-25 DIAGNOSIS — N183 Chronic kidney disease, stage 3 (moderate): Secondary | ICD-10-CM | POA: Diagnosis not present

## 2017-12-26 DIAGNOSIS — N183 Chronic kidney disease, stage 3 (moderate): Secondary | ICD-10-CM | POA: Diagnosis not present

## 2017-12-26 DIAGNOSIS — D61818 Other pancytopenia: Secondary | ICD-10-CM | POA: Diagnosis not present

## 2017-12-26 DIAGNOSIS — D509 Iron deficiency anemia, unspecified: Secondary | ICD-10-CM | POA: Diagnosis not present

## 2017-12-26 DIAGNOSIS — N390 Urinary tract infection, site not specified: Secondary | ICD-10-CM | POA: Diagnosis not present

## 2017-12-26 DIAGNOSIS — I129 Hypertensive chronic kidney disease with stage 1 through stage 4 chronic kidney disease, or unspecified chronic kidney disease: Secondary | ICD-10-CM | POA: Diagnosis not present

## 2017-12-26 DIAGNOSIS — E1122 Type 2 diabetes mellitus with diabetic chronic kidney disease: Secondary | ICD-10-CM | POA: Diagnosis not present

## 2017-12-26 DIAGNOSIS — M1611 Unilateral primary osteoarthritis, right hip: Secondary | ICD-10-CM | POA: Diagnosis not present

## 2017-12-30 DIAGNOSIS — E1122 Type 2 diabetes mellitus with diabetic chronic kidney disease: Secondary | ICD-10-CM | POA: Diagnosis not present

## 2017-12-30 DIAGNOSIS — N183 Chronic kidney disease, stage 3 (moderate): Secondary | ICD-10-CM | POA: Diagnosis not present

## 2017-12-30 DIAGNOSIS — D61818 Other pancytopenia: Secondary | ICD-10-CM | POA: Diagnosis not present

## 2017-12-30 DIAGNOSIS — Z8781 Personal history of (healed) traumatic fracture: Secondary | ICD-10-CM | POA: Diagnosis not present

## 2017-12-30 DIAGNOSIS — M80051D Age-related osteoporosis with current pathological fracture, right femur, subsequent encounter for fracture with routine healing: Secondary | ICD-10-CM | POA: Diagnosis not present

## 2017-12-30 DIAGNOSIS — Z96641 Presence of right artificial hip joint: Secondary | ICD-10-CM | POA: Diagnosis not present

## 2017-12-30 DIAGNOSIS — K219 Gastro-esophageal reflux disease without esophagitis: Secondary | ICD-10-CM | POA: Diagnosis not present

## 2017-12-30 DIAGNOSIS — I129 Hypertensive chronic kidney disease with stage 1 through stage 4 chronic kidney disease, or unspecified chronic kidney disease: Secondary | ICD-10-CM | POA: Diagnosis not present

## 2017-12-30 DIAGNOSIS — Z9181 History of falling: Secondary | ICD-10-CM | POA: Diagnosis not present

## 2017-12-30 DIAGNOSIS — D509 Iron deficiency anemia, unspecified: Secondary | ICD-10-CM | POA: Diagnosis not present

## 2018-01-02 DIAGNOSIS — D61818 Other pancytopenia: Secondary | ICD-10-CM | POA: Diagnosis not present

## 2018-01-02 DIAGNOSIS — I129 Hypertensive chronic kidney disease with stage 1 through stage 4 chronic kidney disease, or unspecified chronic kidney disease: Secondary | ICD-10-CM | POA: Diagnosis not present

## 2018-01-02 DIAGNOSIS — N183 Chronic kidney disease, stage 3 (moderate): Secondary | ICD-10-CM | POA: Diagnosis not present

## 2018-01-02 DIAGNOSIS — E1122 Type 2 diabetes mellitus with diabetic chronic kidney disease: Secondary | ICD-10-CM | POA: Diagnosis not present

## 2018-01-02 DIAGNOSIS — D509 Iron deficiency anemia, unspecified: Secondary | ICD-10-CM | POA: Diagnosis not present

## 2018-01-02 DIAGNOSIS — M80051D Age-related osteoporosis with current pathological fracture, right femur, subsequent encounter for fracture with routine healing: Secondary | ICD-10-CM | POA: Diagnosis not present

## 2018-01-03 DIAGNOSIS — I129 Hypertensive chronic kidney disease with stage 1 through stage 4 chronic kidney disease, or unspecified chronic kidney disease: Secondary | ICD-10-CM | POA: Diagnosis not present

## 2018-01-03 DIAGNOSIS — M80051D Age-related osteoporosis with current pathological fracture, right femur, subsequent encounter for fracture with routine healing: Secondary | ICD-10-CM | POA: Diagnosis not present

## 2018-01-03 DIAGNOSIS — E1122 Type 2 diabetes mellitus with diabetic chronic kidney disease: Secondary | ICD-10-CM | POA: Diagnosis not present

## 2018-01-03 DIAGNOSIS — N183 Chronic kidney disease, stage 3 (moderate): Secondary | ICD-10-CM | POA: Diagnosis not present

## 2018-01-03 DIAGNOSIS — D61818 Other pancytopenia: Secondary | ICD-10-CM | POA: Diagnosis not present

## 2018-01-03 DIAGNOSIS — D509 Iron deficiency anemia, unspecified: Secondary | ICD-10-CM | POA: Diagnosis not present

## 2018-01-06 DIAGNOSIS — E1165 Type 2 diabetes mellitus with hyperglycemia: Secondary | ICD-10-CM | POA: Diagnosis not present

## 2018-01-06 DIAGNOSIS — Z6829 Body mass index (BMI) 29.0-29.9, adult: Secondary | ICD-10-CM | POA: Diagnosis not present

## 2018-01-06 DIAGNOSIS — I5033 Acute on chronic diastolic (congestive) heart failure: Secondary | ICD-10-CM | POA: Diagnosis not present

## 2018-01-07 DIAGNOSIS — E1122 Type 2 diabetes mellitus with diabetic chronic kidney disease: Secondary | ICD-10-CM | POA: Diagnosis not present

## 2018-01-07 DIAGNOSIS — D509 Iron deficiency anemia, unspecified: Secondary | ICD-10-CM | POA: Diagnosis not present

## 2018-01-07 DIAGNOSIS — D61818 Other pancytopenia: Secondary | ICD-10-CM | POA: Diagnosis not present

## 2018-01-07 DIAGNOSIS — N183 Chronic kidney disease, stage 3 (moderate): Secondary | ICD-10-CM | POA: Diagnosis not present

## 2018-01-07 DIAGNOSIS — I129 Hypertensive chronic kidney disease with stage 1 through stage 4 chronic kidney disease, or unspecified chronic kidney disease: Secondary | ICD-10-CM | POA: Diagnosis not present

## 2018-01-07 DIAGNOSIS — M80051D Age-related osteoporosis with current pathological fracture, right femur, subsequent encounter for fracture with routine healing: Secondary | ICD-10-CM | POA: Diagnosis not present

## 2018-01-09 DIAGNOSIS — M80051D Age-related osteoporosis with current pathological fracture, right femur, subsequent encounter for fracture with routine healing: Secondary | ICD-10-CM | POA: Diagnosis not present

## 2018-01-09 DIAGNOSIS — D61818 Other pancytopenia: Secondary | ICD-10-CM | POA: Diagnosis not present

## 2018-01-09 DIAGNOSIS — N183 Chronic kidney disease, stage 3 (moderate): Secondary | ICD-10-CM | POA: Diagnosis not present

## 2018-01-09 DIAGNOSIS — D509 Iron deficiency anemia, unspecified: Secondary | ICD-10-CM | POA: Diagnosis not present

## 2018-01-09 DIAGNOSIS — E1122 Type 2 diabetes mellitus with diabetic chronic kidney disease: Secondary | ICD-10-CM | POA: Diagnosis not present

## 2018-01-09 DIAGNOSIS — I129 Hypertensive chronic kidney disease with stage 1 through stage 4 chronic kidney disease, or unspecified chronic kidney disease: Secondary | ICD-10-CM | POA: Diagnosis not present

## 2018-01-10 DIAGNOSIS — E1122 Type 2 diabetes mellitus with diabetic chronic kidney disease: Secondary | ICD-10-CM | POA: Diagnosis not present

## 2018-01-10 DIAGNOSIS — D61818 Other pancytopenia: Secondary | ICD-10-CM | POA: Diagnosis not present

## 2018-01-10 DIAGNOSIS — I129 Hypertensive chronic kidney disease with stage 1 through stage 4 chronic kidney disease, or unspecified chronic kidney disease: Secondary | ICD-10-CM | POA: Diagnosis not present

## 2018-01-10 DIAGNOSIS — N183 Chronic kidney disease, stage 3 (moderate): Secondary | ICD-10-CM | POA: Diagnosis not present

## 2018-01-10 DIAGNOSIS — D509 Iron deficiency anemia, unspecified: Secondary | ICD-10-CM | POA: Diagnosis not present

## 2018-01-10 DIAGNOSIS — M80051D Age-related osteoporosis with current pathological fracture, right femur, subsequent encounter for fracture with routine healing: Secondary | ICD-10-CM | POA: Diagnosis not present

## 2018-01-14 DIAGNOSIS — K219 Gastro-esophageal reflux disease without esophagitis: Secondary | ICD-10-CM | POA: Diagnosis not present

## 2018-01-14 DIAGNOSIS — N184 Chronic kidney disease, stage 4 (severe): Secondary | ICD-10-CM | POA: Diagnosis not present

## 2018-01-14 DIAGNOSIS — E782 Mixed hyperlipidemia: Secondary | ICD-10-CM | POA: Diagnosis not present

## 2018-01-14 DIAGNOSIS — E1122 Type 2 diabetes mellitus with diabetic chronic kidney disease: Secondary | ICD-10-CM | POA: Diagnosis not present

## 2018-01-14 DIAGNOSIS — D509 Iron deficiency anemia, unspecified: Secondary | ICD-10-CM | POA: Diagnosis not present

## 2018-01-14 DIAGNOSIS — I10 Essential (primary) hypertension: Secondary | ICD-10-CM | POA: Diagnosis not present

## 2018-01-14 DIAGNOSIS — K7581 Nonalcoholic steatohepatitis (NASH): Secondary | ICD-10-CM | POA: Diagnosis not present

## 2018-01-14 DIAGNOSIS — I5033 Acute on chronic diastolic (congestive) heart failure: Secondary | ICD-10-CM | POA: Diagnosis not present

## 2018-01-14 DIAGNOSIS — I129 Hypertensive chronic kidney disease with stage 1 through stage 4 chronic kidney disease, or unspecified chronic kidney disease: Secondary | ICD-10-CM | POA: Diagnosis not present

## 2018-01-14 DIAGNOSIS — E1165 Type 2 diabetes mellitus with hyperglycemia: Secondary | ICD-10-CM | POA: Diagnosis not present

## 2018-01-14 DIAGNOSIS — D61818 Other pancytopenia: Secondary | ICD-10-CM | POA: Diagnosis not present

## 2018-01-14 DIAGNOSIS — M80051D Age-related osteoporosis with current pathological fracture, right femur, subsequent encounter for fracture with routine healing: Secondary | ICD-10-CM | POA: Diagnosis not present

## 2018-01-14 DIAGNOSIS — N183 Chronic kidney disease, stage 3 (moderate): Secondary | ICD-10-CM | POA: Diagnosis not present

## 2018-01-15 DIAGNOSIS — M170 Bilateral primary osteoarthritis of knee: Secondary | ICD-10-CM | POA: Diagnosis not present

## 2018-01-15 DIAGNOSIS — M25561 Pain in right knee: Secondary | ICD-10-CM | POA: Diagnosis not present

## 2018-01-15 DIAGNOSIS — M25562 Pain in left knee: Secondary | ICD-10-CM | POA: Diagnosis not present

## 2018-01-15 DIAGNOSIS — S72001D Fracture of unspecified part of neck of right femur, subsequent encounter for closed fracture with routine healing: Secondary | ICD-10-CM | POA: Diagnosis not present

## 2018-01-17 DIAGNOSIS — I129 Hypertensive chronic kidney disease with stage 1 through stage 4 chronic kidney disease, or unspecified chronic kidney disease: Secondary | ICD-10-CM | POA: Diagnosis not present

## 2018-01-17 DIAGNOSIS — N183 Chronic kidney disease, stage 3 (moderate): Secondary | ICD-10-CM | POA: Diagnosis not present

## 2018-01-17 DIAGNOSIS — E1122 Type 2 diabetes mellitus with diabetic chronic kidney disease: Secondary | ICD-10-CM | POA: Diagnosis not present

## 2018-01-17 DIAGNOSIS — M80051D Age-related osteoporosis with current pathological fracture, right femur, subsequent encounter for fracture with routine healing: Secondary | ICD-10-CM | POA: Diagnosis not present

## 2018-01-17 DIAGNOSIS — D509 Iron deficiency anemia, unspecified: Secondary | ICD-10-CM | POA: Diagnosis not present

## 2018-01-17 DIAGNOSIS — D61818 Other pancytopenia: Secondary | ICD-10-CM | POA: Diagnosis not present

## 2018-01-20 DIAGNOSIS — E1122 Type 2 diabetes mellitus with diabetic chronic kidney disease: Secondary | ICD-10-CM | POA: Diagnosis not present

## 2018-01-20 DIAGNOSIS — D61818 Other pancytopenia: Secondary | ICD-10-CM | POA: Diagnosis not present

## 2018-01-20 DIAGNOSIS — M80051D Age-related osteoporosis with current pathological fracture, right femur, subsequent encounter for fracture with routine healing: Secondary | ICD-10-CM | POA: Diagnosis not present

## 2018-01-20 DIAGNOSIS — D509 Iron deficiency anemia, unspecified: Secondary | ICD-10-CM | POA: Diagnosis not present

## 2018-01-20 DIAGNOSIS — N183 Chronic kidney disease, stage 3 (moderate): Secondary | ICD-10-CM | POA: Diagnosis not present

## 2018-01-20 DIAGNOSIS — I129 Hypertensive chronic kidney disease with stage 1 through stage 4 chronic kidney disease, or unspecified chronic kidney disease: Secondary | ICD-10-CM | POA: Diagnosis not present

## 2018-01-21 DIAGNOSIS — M80051D Age-related osteoporosis with current pathological fracture, right femur, subsequent encounter for fracture with routine healing: Secondary | ICD-10-CM | POA: Diagnosis not present

## 2018-01-21 DIAGNOSIS — N183 Chronic kidney disease, stage 3 (moderate): Secondary | ICD-10-CM | POA: Diagnosis not present

## 2018-01-21 DIAGNOSIS — I129 Hypertensive chronic kidney disease with stage 1 through stage 4 chronic kidney disease, or unspecified chronic kidney disease: Secondary | ICD-10-CM | POA: Diagnosis not present

## 2018-01-21 DIAGNOSIS — D509 Iron deficiency anemia, unspecified: Secondary | ICD-10-CM | POA: Diagnosis not present

## 2018-01-21 DIAGNOSIS — E1122 Type 2 diabetes mellitus with diabetic chronic kidney disease: Secondary | ICD-10-CM | POA: Diagnosis not present

## 2018-01-21 DIAGNOSIS — D61818 Other pancytopenia: Secondary | ICD-10-CM | POA: Diagnosis not present

## 2018-01-23 DIAGNOSIS — E1122 Type 2 diabetes mellitus with diabetic chronic kidney disease: Secondary | ICD-10-CM | POA: Diagnosis not present

## 2018-01-23 DIAGNOSIS — N183 Chronic kidney disease, stage 3 (moderate): Secondary | ICD-10-CM | POA: Diagnosis not present

## 2018-01-23 DIAGNOSIS — M80051D Age-related osteoporosis with current pathological fracture, right femur, subsequent encounter for fracture with routine healing: Secondary | ICD-10-CM | POA: Diagnosis not present

## 2018-01-23 DIAGNOSIS — I129 Hypertensive chronic kidney disease with stage 1 through stage 4 chronic kidney disease, or unspecified chronic kidney disease: Secondary | ICD-10-CM | POA: Diagnosis not present

## 2018-01-23 DIAGNOSIS — D509 Iron deficiency anemia, unspecified: Secondary | ICD-10-CM | POA: Diagnosis not present

## 2018-01-23 DIAGNOSIS — D61818 Other pancytopenia: Secondary | ICD-10-CM | POA: Diagnosis not present

## 2018-01-27 DIAGNOSIS — D509 Iron deficiency anemia, unspecified: Secondary | ICD-10-CM | POA: Diagnosis not present

## 2018-01-28 DIAGNOSIS — D61818 Other pancytopenia: Secondary | ICD-10-CM | POA: Diagnosis not present

## 2018-01-28 DIAGNOSIS — I129 Hypertensive chronic kidney disease with stage 1 through stage 4 chronic kidney disease, or unspecified chronic kidney disease: Secondary | ICD-10-CM | POA: Diagnosis not present

## 2018-01-28 DIAGNOSIS — N183 Chronic kidney disease, stage 3 (moderate): Secondary | ICD-10-CM | POA: Diagnosis not present

## 2018-01-28 DIAGNOSIS — D509 Iron deficiency anemia, unspecified: Secondary | ICD-10-CM | POA: Diagnosis not present

## 2018-01-28 DIAGNOSIS — E1122 Type 2 diabetes mellitus with diabetic chronic kidney disease: Secondary | ICD-10-CM | POA: Diagnosis not present

## 2018-01-28 DIAGNOSIS — M80051D Age-related osteoporosis with current pathological fracture, right femur, subsequent encounter for fracture with routine healing: Secondary | ICD-10-CM | POA: Diagnosis not present

## 2018-02-10 DIAGNOSIS — I129 Hypertensive chronic kidney disease with stage 1 through stage 4 chronic kidney disease, or unspecified chronic kidney disease: Secondary | ICD-10-CM | POA: Diagnosis not present

## 2018-02-10 DIAGNOSIS — D61818 Other pancytopenia: Secondary | ICD-10-CM | POA: Diagnosis not present

## 2018-02-10 DIAGNOSIS — N183 Chronic kidney disease, stage 3 (moderate): Secondary | ICD-10-CM | POA: Diagnosis not present

## 2018-02-10 DIAGNOSIS — M80051D Age-related osteoporosis with current pathological fracture, right femur, subsequent encounter for fracture with routine healing: Secondary | ICD-10-CM | POA: Diagnosis not present

## 2018-02-10 DIAGNOSIS — N390 Urinary tract infection, site not specified: Secondary | ICD-10-CM | POA: Diagnosis not present

## 2018-02-10 DIAGNOSIS — D509 Iron deficiency anemia, unspecified: Secondary | ICD-10-CM | POA: Diagnosis not present

## 2018-02-10 DIAGNOSIS — E1122 Type 2 diabetes mellitus with diabetic chronic kidney disease: Secondary | ICD-10-CM | POA: Diagnosis not present

## 2018-02-16 DIAGNOSIS — E1122 Type 2 diabetes mellitus with diabetic chronic kidney disease: Secondary | ICD-10-CM | POA: Diagnosis not present

## 2018-02-16 DIAGNOSIS — I129 Hypertensive chronic kidney disease with stage 1 through stage 4 chronic kidney disease, or unspecified chronic kidney disease: Secondary | ICD-10-CM | POA: Diagnosis not present

## 2018-02-16 DIAGNOSIS — D61818 Other pancytopenia: Secondary | ICD-10-CM | POA: Diagnosis not present

## 2018-02-16 DIAGNOSIS — N183 Chronic kidney disease, stage 3 (moderate): Secondary | ICD-10-CM | POA: Diagnosis not present

## 2018-02-16 DIAGNOSIS — M80051D Age-related osteoporosis with current pathological fracture, right femur, subsequent encounter for fracture with routine healing: Secondary | ICD-10-CM | POA: Diagnosis not present

## 2018-02-16 DIAGNOSIS — D509 Iron deficiency anemia, unspecified: Secondary | ICD-10-CM | POA: Diagnosis not present

## 2018-02-18 DIAGNOSIS — I509 Heart failure, unspecified: Secondary | ICD-10-CM | POA: Diagnosis not present

## 2018-02-18 DIAGNOSIS — M80051D Age-related osteoporosis with current pathological fracture, right femur, subsequent encounter for fracture with routine healing: Secondary | ICD-10-CM | POA: Diagnosis not present

## 2018-02-18 DIAGNOSIS — D61818 Other pancytopenia: Secondary | ICD-10-CM | POA: Diagnosis not present

## 2018-02-18 DIAGNOSIS — D509 Iron deficiency anemia, unspecified: Secondary | ICD-10-CM | POA: Diagnosis not present

## 2018-02-18 DIAGNOSIS — E1122 Type 2 diabetes mellitus with diabetic chronic kidney disease: Secondary | ICD-10-CM | POA: Diagnosis not present

## 2018-02-18 DIAGNOSIS — N183 Chronic kidney disease, stage 3 (moderate): Secondary | ICD-10-CM | POA: Diagnosis not present

## 2018-02-18 DIAGNOSIS — I129 Hypertensive chronic kidney disease with stage 1 through stage 4 chronic kidney disease, or unspecified chronic kidney disease: Secondary | ICD-10-CM | POA: Diagnosis not present

## 2018-02-26 DIAGNOSIS — E1122 Type 2 diabetes mellitus with diabetic chronic kidney disease: Secondary | ICD-10-CM | POA: Diagnosis not present

## 2018-02-26 DIAGNOSIS — D61818 Other pancytopenia: Secondary | ICD-10-CM | POA: Diagnosis not present

## 2018-02-26 DIAGNOSIS — N183 Chronic kidney disease, stage 3 (moderate): Secondary | ICD-10-CM | POA: Diagnosis not present

## 2018-02-26 DIAGNOSIS — D509 Iron deficiency anemia, unspecified: Secondary | ICD-10-CM | POA: Diagnosis not present

## 2018-02-26 DIAGNOSIS — I129 Hypertensive chronic kidney disease with stage 1 through stage 4 chronic kidney disease, or unspecified chronic kidney disease: Secondary | ICD-10-CM | POA: Diagnosis not present

## 2018-02-26 DIAGNOSIS — M80051D Age-related osteoporosis with current pathological fracture, right femur, subsequent encounter for fracture with routine healing: Secondary | ICD-10-CM | POA: Diagnosis not present

## 2018-02-28 DIAGNOSIS — N183 Chronic kidney disease, stage 3 (moderate): Secondary | ICD-10-CM | POA: Diagnosis not present

## 2018-02-28 DIAGNOSIS — Z8781 Personal history of (healed) traumatic fracture: Secondary | ICD-10-CM | POA: Diagnosis not present

## 2018-02-28 DIAGNOSIS — D509 Iron deficiency anemia, unspecified: Secondary | ICD-10-CM | POA: Diagnosis not present

## 2018-02-28 DIAGNOSIS — D61818 Other pancytopenia: Secondary | ICD-10-CM | POA: Diagnosis not present

## 2018-02-28 DIAGNOSIS — I129 Hypertensive chronic kidney disease with stage 1 through stage 4 chronic kidney disease, or unspecified chronic kidney disease: Secondary | ICD-10-CM | POA: Diagnosis not present

## 2018-02-28 DIAGNOSIS — M80051D Age-related osteoporosis with current pathological fracture, right femur, subsequent encounter for fracture with routine healing: Secondary | ICD-10-CM | POA: Diagnosis not present

## 2018-02-28 DIAGNOSIS — E1122 Type 2 diabetes mellitus with diabetic chronic kidney disease: Secondary | ICD-10-CM | POA: Diagnosis not present

## 2018-02-28 DIAGNOSIS — Z96641 Presence of right artificial hip joint: Secondary | ICD-10-CM | POA: Diagnosis not present

## 2018-02-28 DIAGNOSIS — Z9181 History of falling: Secondary | ICD-10-CM | POA: Diagnosis not present

## 2018-02-28 DIAGNOSIS — K219 Gastro-esophageal reflux disease without esophagitis: Secondary | ICD-10-CM | POA: Diagnosis not present

## 2018-03-01 DIAGNOSIS — E1165 Type 2 diabetes mellitus with hyperglycemia: Secondary | ICD-10-CM | POA: Diagnosis not present

## 2018-03-01 DIAGNOSIS — E1122 Type 2 diabetes mellitus with diabetic chronic kidney disease: Secondary | ICD-10-CM | POA: Diagnosis not present

## 2018-03-01 DIAGNOSIS — I10 Essential (primary) hypertension: Secondary | ICD-10-CM | POA: Diagnosis not present

## 2018-03-01 DIAGNOSIS — K219 Gastro-esophageal reflux disease without esophagitis: Secondary | ICD-10-CM | POA: Diagnosis not present

## 2018-03-01 DIAGNOSIS — Z23 Encounter for immunization: Secondary | ICD-10-CM | POA: Diagnosis not present

## 2018-03-01 DIAGNOSIS — S42212A Unspecified displaced fracture of surgical neck of left humerus, initial encounter for closed fracture: Secondary | ICD-10-CM | POA: Diagnosis not present

## 2018-03-01 DIAGNOSIS — K7581 Nonalcoholic steatohepatitis (NASH): Secondary | ICD-10-CM | POA: Diagnosis not present

## 2018-03-01 DIAGNOSIS — E782 Mixed hyperlipidemia: Secondary | ICD-10-CM | POA: Diagnosis not present

## 2018-03-01 DIAGNOSIS — I5033 Acute on chronic diastolic (congestive) heart failure: Secondary | ICD-10-CM | POA: Diagnosis not present

## 2018-03-01 DIAGNOSIS — Z6826 Body mass index (BMI) 26.0-26.9, adult: Secondary | ICD-10-CM | POA: Diagnosis not present

## 2018-03-01 DIAGNOSIS — N184 Chronic kidney disease, stage 4 (severe): Secondary | ICD-10-CM | POA: Diagnosis not present

## 2018-03-05 DIAGNOSIS — M80051D Age-related osteoporosis with current pathological fracture, right femur, subsequent encounter for fracture with routine healing: Secondary | ICD-10-CM | POA: Diagnosis not present

## 2018-03-05 DIAGNOSIS — D61818 Other pancytopenia: Secondary | ICD-10-CM | POA: Diagnosis not present

## 2018-03-05 DIAGNOSIS — I129 Hypertensive chronic kidney disease with stage 1 through stage 4 chronic kidney disease, or unspecified chronic kidney disease: Secondary | ICD-10-CM | POA: Diagnosis not present

## 2018-03-05 DIAGNOSIS — N183 Chronic kidney disease, stage 3 (moderate): Secondary | ICD-10-CM | POA: Diagnosis not present

## 2018-03-05 DIAGNOSIS — D509 Iron deficiency anemia, unspecified: Secondary | ICD-10-CM | POA: Diagnosis not present

## 2018-03-05 DIAGNOSIS — E1122 Type 2 diabetes mellitus with diabetic chronic kidney disease: Secondary | ICD-10-CM | POA: Diagnosis not present

## 2018-03-10 DIAGNOSIS — D509 Iron deficiency anemia, unspecified: Secondary | ICD-10-CM | POA: Diagnosis not present

## 2018-03-10 DIAGNOSIS — E1122 Type 2 diabetes mellitus with diabetic chronic kidney disease: Secondary | ICD-10-CM | POA: Diagnosis not present

## 2018-03-10 DIAGNOSIS — M80051D Age-related osteoporosis with current pathological fracture, right femur, subsequent encounter for fracture with routine healing: Secondary | ICD-10-CM | POA: Diagnosis not present

## 2018-03-10 DIAGNOSIS — N183 Chronic kidney disease, stage 3 (moderate): Secondary | ICD-10-CM | POA: Diagnosis not present

## 2018-03-10 DIAGNOSIS — I129 Hypertensive chronic kidney disease with stage 1 through stage 4 chronic kidney disease, or unspecified chronic kidney disease: Secondary | ICD-10-CM | POA: Diagnosis not present

## 2018-03-10 DIAGNOSIS — D61818 Other pancytopenia: Secondary | ICD-10-CM | POA: Diagnosis not present

## 2018-03-11 DIAGNOSIS — E1122 Type 2 diabetes mellitus with diabetic chronic kidney disease: Secondary | ICD-10-CM | POA: Diagnosis not present

## 2018-03-11 DIAGNOSIS — I129 Hypertensive chronic kidney disease with stage 1 through stage 4 chronic kidney disease, or unspecified chronic kidney disease: Secondary | ICD-10-CM | POA: Diagnosis not present

## 2018-03-11 DIAGNOSIS — D509 Iron deficiency anemia, unspecified: Secondary | ICD-10-CM | POA: Diagnosis not present

## 2018-03-11 DIAGNOSIS — I509 Heart failure, unspecified: Secondary | ICD-10-CM | POA: Diagnosis not present

## 2018-03-11 DIAGNOSIS — M80051D Age-related osteoporosis with current pathological fracture, right femur, subsequent encounter for fracture with routine healing: Secondary | ICD-10-CM | POA: Diagnosis not present

## 2018-03-11 DIAGNOSIS — N183 Chronic kidney disease, stage 3 (moderate): Secondary | ICD-10-CM | POA: Diagnosis not present

## 2018-03-11 DIAGNOSIS — D61818 Other pancytopenia: Secondary | ICD-10-CM | POA: Diagnosis not present

## 2018-03-13 DIAGNOSIS — H35033 Hypertensive retinopathy, bilateral: Secondary | ICD-10-CM | POA: Diagnosis not present

## 2018-03-26 DIAGNOSIS — M80051D Age-related osteoporosis with current pathological fracture, right femur, subsequent encounter for fracture with routine healing: Secondary | ICD-10-CM | POA: Diagnosis not present

## 2018-03-26 DIAGNOSIS — I129 Hypertensive chronic kidney disease with stage 1 through stage 4 chronic kidney disease, or unspecified chronic kidney disease: Secondary | ICD-10-CM | POA: Diagnosis not present

## 2018-03-26 DIAGNOSIS — D61818 Other pancytopenia: Secondary | ICD-10-CM | POA: Diagnosis not present

## 2018-03-26 DIAGNOSIS — N183 Chronic kidney disease, stage 3 (moderate): Secondary | ICD-10-CM | POA: Diagnosis not present

## 2018-03-26 DIAGNOSIS — E1122 Type 2 diabetes mellitus with diabetic chronic kidney disease: Secondary | ICD-10-CM | POA: Diagnosis not present

## 2018-03-26 DIAGNOSIS — D509 Iron deficiency anemia, unspecified: Secondary | ICD-10-CM | POA: Diagnosis not present

## 2018-04-07 DIAGNOSIS — M80051D Age-related osteoporosis with current pathological fracture, right femur, subsequent encounter for fracture with routine healing: Secondary | ICD-10-CM | POA: Diagnosis not present

## 2018-04-07 DIAGNOSIS — E1122 Type 2 diabetes mellitus with diabetic chronic kidney disease: Secondary | ICD-10-CM | POA: Diagnosis not present

## 2018-04-07 DIAGNOSIS — D509 Iron deficiency anemia, unspecified: Secondary | ICD-10-CM | POA: Diagnosis not present

## 2018-04-07 DIAGNOSIS — N183 Chronic kidney disease, stage 3 (moderate): Secondary | ICD-10-CM | POA: Diagnosis not present

## 2018-04-07 DIAGNOSIS — D61818 Other pancytopenia: Secondary | ICD-10-CM | POA: Diagnosis not present

## 2018-04-07 DIAGNOSIS — I129 Hypertensive chronic kidney disease with stage 1 through stage 4 chronic kidney disease, or unspecified chronic kidney disease: Secondary | ICD-10-CM | POA: Diagnosis not present

## 2018-04-21 DIAGNOSIS — D61818 Other pancytopenia: Secondary | ICD-10-CM | POA: Diagnosis not present

## 2018-04-21 DIAGNOSIS — Z23 Encounter for immunization: Secondary | ICD-10-CM | POA: Diagnosis not present

## 2018-04-21 DIAGNOSIS — D509 Iron deficiency anemia, unspecified: Secondary | ICD-10-CM | POA: Diagnosis not present

## 2018-04-21 DIAGNOSIS — Z0001 Encounter for general adult medical examination with abnormal findings: Secondary | ICD-10-CM | POA: Diagnosis not present

## 2018-04-21 DIAGNOSIS — D519 Vitamin B12 deficiency anemia, unspecified: Secondary | ICD-10-CM | POA: Diagnosis not present

## 2018-04-21 DIAGNOSIS — E1165 Type 2 diabetes mellitus with hyperglycemia: Secondary | ICD-10-CM | POA: Diagnosis not present

## 2018-04-21 DIAGNOSIS — Z6829 Body mass index (BMI) 29.0-29.9, adult: Secondary | ICD-10-CM | POA: Diagnosis not present

## 2018-04-21 DIAGNOSIS — N184 Chronic kidney disease, stage 4 (severe): Secondary | ICD-10-CM | POA: Diagnosis not present

## 2018-04-21 DIAGNOSIS — I10 Essential (primary) hypertension: Secondary | ICD-10-CM | POA: Diagnosis not present

## 2018-04-21 DIAGNOSIS — E1122 Type 2 diabetes mellitus with diabetic chronic kidney disease: Secondary | ICD-10-CM | POA: Diagnosis not present

## 2018-04-21 DIAGNOSIS — N183 Chronic kidney disease, stage 3 (moderate): Secondary | ICD-10-CM | POA: Diagnosis not present

## 2018-04-21 DIAGNOSIS — M80051D Age-related osteoporosis with current pathological fracture, right femur, subsequent encounter for fracture with routine healing: Secondary | ICD-10-CM | POA: Diagnosis not present

## 2018-04-21 DIAGNOSIS — E782 Mixed hyperlipidemia: Secondary | ICD-10-CM | POA: Diagnosis not present

## 2018-04-21 DIAGNOSIS — I129 Hypertensive chronic kidney disease with stage 1 through stage 4 chronic kidney disease, or unspecified chronic kidney disease: Secondary | ICD-10-CM | POA: Diagnosis not present

## 2018-04-21 DIAGNOSIS — K219 Gastro-esophageal reflux disease without esophagitis: Secondary | ICD-10-CM | POA: Diagnosis not present

## 2018-04-21 DIAGNOSIS — I5033 Acute on chronic diastolic (congestive) heart failure: Secondary | ICD-10-CM | POA: Diagnosis not present

## 2018-04-21 DIAGNOSIS — S42212A Unspecified displaced fracture of surgical neck of left humerus, initial encounter for closed fracture: Secondary | ICD-10-CM | POA: Diagnosis not present

## 2018-04-21 DIAGNOSIS — K7581 Nonalcoholic steatohepatitis (NASH): Secondary | ICD-10-CM | POA: Diagnosis not present

## 2018-04-22 DIAGNOSIS — E1122 Type 2 diabetes mellitus with diabetic chronic kidney disease: Secondary | ICD-10-CM | POA: Diagnosis not present

## 2018-04-22 DIAGNOSIS — N183 Chronic kidney disease, stage 3 (moderate): Secondary | ICD-10-CM | POA: Diagnosis not present

## 2018-04-22 DIAGNOSIS — D509 Iron deficiency anemia, unspecified: Secondary | ICD-10-CM | POA: Diagnosis not present

## 2018-04-22 DIAGNOSIS — I129 Hypertensive chronic kidney disease with stage 1 through stage 4 chronic kidney disease, or unspecified chronic kidney disease: Secondary | ICD-10-CM | POA: Diagnosis not present

## 2018-05-14 DIAGNOSIS — M170 Bilateral primary osteoarthritis of knee: Secondary | ICD-10-CM | POA: Diagnosis not present

## 2018-05-15 DIAGNOSIS — E782 Mixed hyperlipidemia: Secondary | ICD-10-CM | POA: Diagnosis not present

## 2018-05-15 DIAGNOSIS — I10 Essential (primary) hypertension: Secondary | ICD-10-CM | POA: Diagnosis not present

## 2018-05-15 DIAGNOSIS — E1122 Type 2 diabetes mellitus with diabetic chronic kidney disease: Secondary | ICD-10-CM | POA: Diagnosis not present

## 2018-07-22 DIAGNOSIS — D638 Anemia in other chronic diseases classified elsewhere: Secondary | ICD-10-CM | POA: Diagnosis not present

## 2018-07-22 DIAGNOSIS — Z23 Encounter for immunization: Secondary | ICD-10-CM | POA: Diagnosis not present

## 2018-07-22 DIAGNOSIS — K7581 Nonalcoholic steatohepatitis (NASH): Secondary | ICD-10-CM | POA: Diagnosis not present

## 2018-07-22 DIAGNOSIS — K219 Gastro-esophageal reflux disease without esophagitis: Secondary | ICD-10-CM | POA: Diagnosis not present

## 2018-07-22 DIAGNOSIS — E1165 Type 2 diabetes mellitus with hyperglycemia: Secondary | ICD-10-CM | POA: Diagnosis not present

## 2018-07-22 DIAGNOSIS — D509 Iron deficiency anemia, unspecified: Secondary | ICD-10-CM | POA: Diagnosis not present

## 2018-07-22 DIAGNOSIS — E1122 Type 2 diabetes mellitus with diabetic chronic kidney disease: Secondary | ICD-10-CM | POA: Diagnosis not present

## 2018-07-22 DIAGNOSIS — E782 Mixed hyperlipidemia: Secondary | ICD-10-CM | POA: Diagnosis not present

## 2018-07-22 DIAGNOSIS — I5033 Acute on chronic diastolic (congestive) heart failure: Secondary | ICD-10-CM | POA: Diagnosis not present

## 2018-07-22 DIAGNOSIS — Z6827 Body mass index (BMI) 27.0-27.9, adult: Secondary | ICD-10-CM | POA: Diagnosis not present

## 2018-07-22 DIAGNOSIS — N184 Chronic kidney disease, stage 4 (severe): Secondary | ICD-10-CM | POA: Diagnosis not present

## 2018-07-22 DIAGNOSIS — I10 Essential (primary) hypertension: Secondary | ICD-10-CM | POA: Diagnosis not present

## 2018-07-22 DIAGNOSIS — S42212A Unspecified displaced fracture of surgical neck of left humerus, initial encounter for closed fracture: Secondary | ICD-10-CM | POA: Diagnosis not present

## 2018-08-14 DIAGNOSIS — R609 Edema, unspecified: Secondary | ICD-10-CM | POA: Diagnosis not present

## 2018-08-14 DIAGNOSIS — W19XXXA Unspecified fall, initial encounter: Secondary | ICD-10-CM | POA: Diagnosis not present

## 2018-08-14 DIAGNOSIS — M25561 Pain in right knee: Secondary | ICD-10-CM | POA: Diagnosis not present

## 2018-08-15 DIAGNOSIS — S72491A Other fracture of lower end of right femur, initial encounter for closed fracture: Secondary | ICD-10-CM | POA: Diagnosis not present

## 2018-08-15 DIAGNOSIS — D519 Vitamin B12 deficiency anemia, unspecified: Secondary | ICD-10-CM | POA: Diagnosis present

## 2018-08-15 DIAGNOSIS — E1122 Type 2 diabetes mellitus with diabetic chronic kidney disease: Secondary | ICD-10-CM | POA: Diagnosis present

## 2018-08-15 DIAGNOSIS — S72331A Displaced oblique fracture of shaft of right femur, initial encounter for closed fracture: Secondary | ICD-10-CM | POA: Diagnosis not present

## 2018-08-15 DIAGNOSIS — Z794 Long term (current) use of insulin: Secondary | ICD-10-CM | POA: Diagnosis not present

## 2018-08-15 DIAGNOSIS — K7200 Acute and subacute hepatic failure without coma: Secondary | ICD-10-CM | POA: Diagnosis not present

## 2018-08-15 DIAGNOSIS — R279 Unspecified lack of coordination: Secondary | ICD-10-CM | POA: Diagnosis not present

## 2018-08-15 DIAGNOSIS — K7290 Hepatic failure, unspecified without coma: Secondary | ICD-10-CM | POA: Diagnosis not present

## 2018-08-15 DIAGNOSIS — K5900 Constipation, unspecified: Secondary | ICD-10-CM | POA: Diagnosis not present

## 2018-08-15 DIAGNOSIS — S72401A Unspecified fracture of lower end of right femur, initial encounter for closed fracture: Secondary | ICD-10-CM | POA: Diagnosis not present

## 2018-08-15 DIAGNOSIS — S72401D Unspecified fracture of lower end of right femur, subsequent encounter for closed fracture with routine healing: Secondary | ICD-10-CM | POA: Diagnosis not present

## 2018-08-15 DIAGNOSIS — W19XXXD Unspecified fall, subsequent encounter: Secondary | ICD-10-CM | POA: Diagnosis not present

## 2018-08-15 DIAGNOSIS — R633 Feeding difficulties: Secondary | ICD-10-CM | POA: Diagnosis not present

## 2018-08-15 DIAGNOSIS — I959 Hypotension, unspecified: Secondary | ICD-10-CM | POA: Diagnosis not present

## 2018-08-15 DIAGNOSIS — R1312 Dysphagia, oropharyngeal phase: Secondary | ICD-10-CM | POA: Diagnosis not present

## 2018-08-15 DIAGNOSIS — I081 Rheumatic disorders of both mitral and tricuspid valves: Secondary | ICD-10-CM | POA: Diagnosis not present

## 2018-08-15 DIAGNOSIS — E785 Hyperlipidemia, unspecified: Secondary | ICD-10-CM | POA: Diagnosis present

## 2018-08-15 DIAGNOSIS — K713 Toxic liver disease with chronic persistent hepatitis: Secondary | ICD-10-CM | POA: Diagnosis not present

## 2018-08-15 DIAGNOSIS — D500 Iron deficiency anemia secondary to blood loss (chronic): Secondary | ICD-10-CM | POA: Diagnosis not present

## 2018-08-15 DIAGNOSIS — D509 Iron deficiency anemia, unspecified: Secondary | ICD-10-CM | POA: Diagnosis present

## 2018-08-15 DIAGNOSIS — M79661 Pain in right lower leg: Secondary | ICD-10-CM | POA: Diagnosis not present

## 2018-08-15 DIAGNOSIS — R41841 Cognitive communication deficit: Secondary | ICD-10-CM | POA: Diagnosis not present

## 2018-08-15 DIAGNOSIS — Z01818 Encounter for other preprocedural examination: Secondary | ICD-10-CM | POA: Diagnosis not present

## 2018-08-15 DIAGNOSIS — Z66 Do not resuscitate: Secondary | ICD-10-CM | POA: Diagnosis present

## 2018-08-15 DIAGNOSIS — R339 Retention of urine, unspecified: Secondary | ICD-10-CM | POA: Diagnosis not present

## 2018-08-15 DIAGNOSIS — K219 Gastro-esophageal reflux disease without esophagitis: Secondary | ICD-10-CM | POA: Diagnosis present

## 2018-08-15 DIAGNOSIS — N184 Chronic kidney disease, stage 4 (severe): Secondary | ICD-10-CM | POA: Diagnosis not present

## 2018-08-15 DIAGNOSIS — M25531 Pain in right wrist: Secondary | ICD-10-CM | POA: Diagnosis not present

## 2018-08-15 DIAGNOSIS — N189 Chronic kidney disease, unspecified: Secondary | ICD-10-CM | POA: Diagnosis not present

## 2018-08-15 DIAGNOSIS — K7581 Nonalcoholic steatohepatitis (NASH): Secondary | ICD-10-CM | POA: Diagnosis present

## 2018-08-15 DIAGNOSIS — M170 Bilateral primary osteoarthritis of knee: Secondary | ICD-10-CM | POA: Diagnosis present

## 2018-08-15 DIAGNOSIS — G8918 Other acute postprocedural pain: Secondary | ICD-10-CM | POA: Diagnosis not present

## 2018-08-15 DIAGNOSIS — Z79899 Other long term (current) drug therapy: Secondary | ICD-10-CM | POA: Diagnosis not present

## 2018-08-15 DIAGNOSIS — M6281 Muscle weakness (generalized): Secondary | ICD-10-CM | POA: Diagnosis not present

## 2018-08-15 DIAGNOSIS — I110 Hypertensive heart disease with heart failure: Secondary | ICD-10-CM | POA: Diagnosis not present

## 2018-08-15 DIAGNOSIS — D62 Acute posthemorrhagic anemia: Secondary | ICD-10-CM | POA: Diagnosis not present

## 2018-08-15 DIAGNOSIS — E611 Iron deficiency: Secondary | ICD-10-CM | POA: Diagnosis not present

## 2018-08-15 DIAGNOSIS — G9349 Other encephalopathy: Secondary | ICD-10-CM | POA: Diagnosis not present

## 2018-08-15 DIAGNOSIS — M1990 Unspecified osteoarthritis, unspecified site: Secondary | ICD-10-CM | POA: Diagnosis not present

## 2018-08-15 DIAGNOSIS — Y92013 Bedroom of single-family (private) house as the place of occurrence of the external cause: Secondary | ICD-10-CM | POA: Diagnosis not present

## 2018-08-15 DIAGNOSIS — E559 Vitamin D deficiency, unspecified: Secondary | ICD-10-CM | POA: Diagnosis not present

## 2018-08-15 DIAGNOSIS — E1169 Type 2 diabetes mellitus with other specified complication: Secondary | ICD-10-CM | POA: Diagnosis not present

## 2018-08-15 DIAGNOSIS — S79929A Unspecified injury of unspecified thigh, initial encounter: Secondary | ICD-10-CM | POA: Diagnosis not present

## 2018-08-15 DIAGNOSIS — R918 Other nonspecific abnormal finding of lung field: Secondary | ICD-10-CM | POA: Diagnosis not present

## 2018-08-15 DIAGNOSIS — E7220 Disorder of urea cycle metabolism, unspecified: Secondary | ICD-10-CM | POA: Diagnosis not present

## 2018-08-15 DIAGNOSIS — R600 Localized edema: Secondary | ICD-10-CM | POA: Diagnosis not present

## 2018-08-15 DIAGNOSIS — D696 Thrombocytopenia, unspecified: Secondary | ICD-10-CM | POA: Diagnosis present

## 2018-08-15 DIAGNOSIS — D649 Anemia, unspecified: Secondary | ICD-10-CM | POA: Diagnosis not present

## 2018-08-15 DIAGNOSIS — D6489 Other specified anemias: Secondary | ICD-10-CM | POA: Diagnosis not present

## 2018-08-15 DIAGNOSIS — K7460 Unspecified cirrhosis of liver: Secondary | ICD-10-CM | POA: Diagnosis present

## 2018-08-15 DIAGNOSIS — I5030 Unspecified diastolic (congestive) heart failure: Secondary | ICD-10-CM | POA: Diagnosis not present

## 2018-08-15 DIAGNOSIS — Z96641 Presence of right artificial hip joint: Secondary | ICD-10-CM | POA: Diagnosis not present

## 2018-08-15 DIAGNOSIS — E871 Hypo-osmolality and hyponatremia: Secondary | ICD-10-CM | POA: Diagnosis not present

## 2018-08-15 DIAGNOSIS — I5032 Chronic diastolic (congestive) heart failure: Secondary | ICD-10-CM | POA: Diagnosis present

## 2018-08-15 DIAGNOSIS — S72341A Displaced spiral fracture of shaft of right femur, initial encounter for closed fracture: Secondary | ICD-10-CM | POA: Diagnosis not present

## 2018-08-15 DIAGNOSIS — D638 Anemia in other chronic diseases classified elsewhere: Secondary | ICD-10-CM | POA: Diagnosis present

## 2018-08-15 DIAGNOSIS — Z8673 Personal history of transient ischemic attack (TIA), and cerebral infarction without residual deficits: Secondary | ICD-10-CM | POA: Diagnosis not present

## 2018-08-15 DIAGNOSIS — F0390 Unspecified dementia without behavioral disturbance: Secondary | ICD-10-CM | POA: Diagnosis present

## 2018-08-15 DIAGNOSIS — Y998 Other external cause status: Secondary | ICD-10-CM | POA: Diagnosis not present

## 2018-08-15 DIAGNOSIS — Z9889 Other specified postprocedural states: Secondary | ICD-10-CM | POA: Diagnosis not present

## 2018-08-15 DIAGNOSIS — Z4659 Encounter for fitting and adjustment of other gastrointestinal appliance and device: Secondary | ICD-10-CM | POA: Diagnosis not present

## 2018-08-15 DIAGNOSIS — S72001D Fracture of unspecified part of neck of right femur, subsequent encounter for closed fracture with routine healing: Secondary | ICD-10-CM | POA: Diagnosis not present

## 2018-08-15 DIAGNOSIS — M79604 Pain in right leg: Secondary | ICD-10-CM | POA: Diagnosis not present

## 2018-08-15 DIAGNOSIS — G9340 Encephalopathy, unspecified: Secondary | ICD-10-CM | POA: Diagnosis not present

## 2018-08-15 DIAGNOSIS — W19XXXA Unspecified fall, initial encounter: Secondary | ICD-10-CM | POA: Diagnosis not present

## 2018-08-15 DIAGNOSIS — E119 Type 2 diabetes mellitus without complications: Secondary | ICD-10-CM | POA: Diagnosis not present

## 2018-08-15 DIAGNOSIS — W01198A Fall on same level from slipping, tripping and stumbling with subsequent striking against other object, initial encounter: Secondary | ICD-10-CM | POA: Diagnosis not present

## 2018-08-15 DIAGNOSIS — R4182 Altered mental status, unspecified: Secondary | ICD-10-CM | POA: Diagnosis not present

## 2018-08-15 DIAGNOSIS — I130 Hypertensive heart and chronic kidney disease with heart failure and stage 1 through stage 4 chronic kidney disease, or unspecified chronic kidney disease: Secondary | ICD-10-CM | POA: Diagnosis present

## 2018-08-15 DIAGNOSIS — N179 Acute kidney failure, unspecified: Secondary | ICD-10-CM | POA: Diagnosis not present

## 2018-08-15 DIAGNOSIS — M80051A Age-related osteoporosis with current pathological fracture, right femur, initial encounter for fracture: Secondary | ICD-10-CM | POA: Diagnosis present

## 2018-08-15 DIAGNOSIS — Z7983 Long term (current) use of bisphosphonates: Secondary | ICD-10-CM | POA: Diagnosis not present

## 2018-08-15 DIAGNOSIS — Y92009 Unspecified place in unspecified non-institutional (private) residence as the place of occurrence of the external cause: Secondary | ICD-10-CM | POA: Diagnosis not present

## 2018-08-15 DIAGNOSIS — H3530 Unspecified macular degeneration: Secondary | ICD-10-CM | POA: Diagnosis not present

## 2018-08-15 DIAGNOSIS — E875 Hyperkalemia: Secondary | ICD-10-CM | POA: Diagnosis not present

## 2018-08-15 DIAGNOSIS — R0689 Other abnormalities of breathing: Secondary | ICD-10-CM | POA: Diagnosis not present

## 2018-08-15 DIAGNOSIS — Z9181 History of falling: Secondary | ICD-10-CM | POA: Diagnosis not present

## 2018-08-15 DIAGNOSIS — Z743 Need for continuous supervision: Secondary | ICD-10-CM | POA: Diagnosis not present

## 2018-08-15 DIAGNOSIS — M7989 Other specified soft tissue disorders: Secondary | ICD-10-CM | POA: Diagnosis not present

## 2018-08-15 DIAGNOSIS — Z01810 Encounter for preprocedural cardiovascular examination: Secondary | ICD-10-CM | POA: Diagnosis not present

## 2018-08-15 DIAGNOSIS — R609 Edema, unspecified: Secondary | ICD-10-CM | POA: Diagnosis not present

## 2018-08-15 DIAGNOSIS — I509 Heart failure, unspecified: Secondary | ICD-10-CM | POA: Diagnosis not present

## 2018-08-15 DIAGNOSIS — N183 Chronic kidney disease, stage 3 (moderate): Secondary | ICD-10-CM | POA: Diagnosis not present

## 2018-09-04 DIAGNOSIS — R279 Unspecified lack of coordination: Secondary | ICD-10-CM | POA: Diagnosis not present

## 2018-09-04 DIAGNOSIS — I130 Hypertensive heart and chronic kidney disease with heart failure and stage 1 through stage 4 chronic kidney disease, or unspecified chronic kidney disease: Secondary | ICD-10-CM | POA: Diagnosis not present

## 2018-09-04 DIAGNOSIS — M6281 Muscle weakness (generalized): Secondary | ICD-10-CM | POA: Diagnosis not present

## 2018-09-04 DIAGNOSIS — K219 Gastro-esophageal reflux disease without esophagitis: Secondary | ICD-10-CM | POA: Diagnosis not present

## 2018-09-04 DIAGNOSIS — Z743 Need for continuous supervision: Secondary | ICD-10-CM | POA: Diagnosis not present

## 2018-09-04 DIAGNOSIS — I959 Hypotension, unspecified: Secondary | ICD-10-CM | POA: Diagnosis not present

## 2018-09-04 DIAGNOSIS — H3530 Unspecified macular degeneration: Secondary | ICD-10-CM | POA: Diagnosis not present

## 2018-09-04 DIAGNOSIS — R600 Localized edema: Secondary | ICD-10-CM | POA: Diagnosis not present

## 2018-09-04 DIAGNOSIS — E119 Type 2 diabetes mellitus without complications: Secondary | ICD-10-CM | POA: Diagnosis not present

## 2018-09-04 DIAGNOSIS — Z9181 History of falling: Secondary | ICD-10-CM | POA: Diagnosis not present

## 2018-09-04 DIAGNOSIS — S79929A Unspecified injury of unspecified thigh, initial encounter: Secondary | ICD-10-CM | POA: Diagnosis not present

## 2018-09-04 DIAGNOSIS — M80051A Age-related osteoporosis with current pathological fracture, right femur, initial encounter for fracture: Secondary | ICD-10-CM | POA: Diagnosis not present

## 2018-09-04 DIAGNOSIS — I5030 Unspecified diastolic (congestive) heart failure: Secondary | ICD-10-CM | POA: Diagnosis not present

## 2018-09-04 DIAGNOSIS — D500 Iron deficiency anemia secondary to blood loss (chronic): Secondary | ICD-10-CM | POA: Diagnosis not present

## 2018-09-04 DIAGNOSIS — N183 Chronic kidney disease, stage 3 (moderate): Secondary | ICD-10-CM | POA: Diagnosis not present

## 2018-09-04 DIAGNOSIS — S72401D Unspecified fracture of lower end of right femur, subsequent encounter for closed fracture with routine healing: Secondary | ICD-10-CM | POA: Diagnosis not present

## 2018-09-04 DIAGNOSIS — E1122 Type 2 diabetes mellitus with diabetic chronic kidney disease: Secondary | ICD-10-CM | POA: Diagnosis not present

## 2018-09-04 DIAGNOSIS — K7460 Unspecified cirrhosis of liver: Secondary | ICD-10-CM | POA: Diagnosis not present

## 2018-09-04 DIAGNOSIS — Z794 Long term (current) use of insulin: Secondary | ICD-10-CM | POA: Diagnosis not present

## 2018-09-04 DIAGNOSIS — R609 Edema, unspecified: Secondary | ICD-10-CM | POA: Diagnosis not present

## 2018-09-04 DIAGNOSIS — R41841 Cognitive communication deficit: Secondary | ICD-10-CM | POA: Diagnosis not present

## 2018-09-04 DIAGNOSIS — D519 Vitamin B12 deficiency anemia, unspecified: Secondary | ICD-10-CM | POA: Diagnosis not present

## 2018-09-04 DIAGNOSIS — D649 Anemia, unspecified: Secondary | ICD-10-CM | POA: Diagnosis not present

## 2018-09-04 DIAGNOSIS — N179 Acute kidney failure, unspecified: Secondary | ICD-10-CM | POA: Diagnosis not present

## 2018-09-04 DIAGNOSIS — S72401A Unspecified fracture of lower end of right femur, initial encounter for closed fracture: Secondary | ICD-10-CM | POA: Diagnosis not present

## 2018-09-04 DIAGNOSIS — E559 Vitamin D deficiency, unspecified: Secondary | ICD-10-CM | POA: Diagnosis not present

## 2018-09-04 DIAGNOSIS — E871 Hypo-osmolality and hyponatremia: Secondary | ICD-10-CM | POA: Diagnosis not present

## 2018-09-04 DIAGNOSIS — S72001D Fracture of unspecified part of neck of right femur, subsequent encounter for closed fracture with routine healing: Secondary | ICD-10-CM | POA: Diagnosis not present

## 2018-09-04 DIAGNOSIS — K7581 Nonalcoholic steatohepatitis (NASH): Secondary | ICD-10-CM | POA: Diagnosis not present

## 2018-09-05 DIAGNOSIS — R609 Edema, unspecified: Secondary | ICD-10-CM | POA: Diagnosis not present

## 2018-09-05 DIAGNOSIS — E119 Type 2 diabetes mellitus without complications: Secondary | ICD-10-CM | POA: Diagnosis not present

## 2018-09-05 DIAGNOSIS — I5030 Unspecified diastolic (congestive) heart failure: Secondary | ICD-10-CM | POA: Diagnosis not present

## 2018-09-05 DIAGNOSIS — S72001D Fracture of unspecified part of neck of right femur, subsequent encounter for closed fracture with routine healing: Secondary | ICD-10-CM | POA: Diagnosis not present

## 2018-09-16 DIAGNOSIS — K7581 Nonalcoholic steatohepatitis (NASH): Secondary | ICD-10-CM | POA: Diagnosis not present

## 2018-09-16 DIAGNOSIS — R609 Edema, unspecified: Secondary | ICD-10-CM | POA: Diagnosis not present

## 2018-09-16 DIAGNOSIS — E559 Vitamin D deficiency, unspecified: Secondary | ICD-10-CM | POA: Diagnosis not present

## 2018-09-16 DIAGNOSIS — Z9181 History of falling: Secondary | ICD-10-CM | POA: Diagnosis not present

## 2018-09-16 DIAGNOSIS — M6281 Muscle weakness (generalized): Secondary | ICD-10-CM | POA: Diagnosis not present

## 2018-09-16 DIAGNOSIS — Z794 Long term (current) use of insulin: Secondary | ICD-10-CM | POA: Diagnosis not present

## 2018-09-16 DIAGNOSIS — I130 Hypertensive heart and chronic kidney disease with heart failure and stage 1 through stage 4 chronic kidney disease, or unspecified chronic kidney disease: Secondary | ICD-10-CM | POA: Diagnosis not present

## 2018-09-16 DIAGNOSIS — N183 Chronic kidney disease, stage 3 (moderate): Secondary | ICD-10-CM | POA: Diagnosis not present

## 2018-09-16 DIAGNOSIS — M79672 Pain in left foot: Secondary | ICD-10-CM | POA: Diagnosis not present

## 2018-09-16 DIAGNOSIS — E871 Hypo-osmolality and hyponatremia: Secondary | ICD-10-CM | POA: Diagnosis not present

## 2018-09-16 DIAGNOSIS — D649 Anemia, unspecified: Secondary | ICD-10-CM | POA: Diagnosis not present

## 2018-09-16 DIAGNOSIS — K7460 Unspecified cirrhosis of liver: Secondary | ICD-10-CM | POA: Diagnosis not present

## 2018-09-16 DIAGNOSIS — S72401D Unspecified fracture of lower end of right femur, subsequent encounter for closed fracture with routine healing: Secondary | ICD-10-CM | POA: Diagnosis not present

## 2018-09-16 DIAGNOSIS — H3530 Unspecified macular degeneration: Secondary | ICD-10-CM | POA: Diagnosis not present

## 2018-09-16 DIAGNOSIS — R41841 Cognitive communication deficit: Secondary | ICD-10-CM | POA: Diagnosis not present

## 2018-09-16 DIAGNOSIS — I5030 Unspecified diastolic (congestive) heart failure: Secondary | ICD-10-CM | POA: Diagnosis not present

## 2018-09-16 DIAGNOSIS — K219 Gastro-esophageal reflux disease without esophagitis: Secondary | ICD-10-CM | POA: Diagnosis not present

## 2018-09-16 DIAGNOSIS — E119 Type 2 diabetes mellitus without complications: Secondary | ICD-10-CM | POA: Diagnosis not present

## 2018-09-16 DIAGNOSIS — S72001D Fracture of unspecified part of neck of right femur, subsequent encounter for closed fracture with routine healing: Secondary | ICD-10-CM | POA: Diagnosis not present

## 2018-09-16 DIAGNOSIS — D519 Vitamin B12 deficiency anemia, unspecified: Secondary | ICD-10-CM | POA: Diagnosis not present

## 2018-09-24 DIAGNOSIS — I5030 Unspecified diastolic (congestive) heart failure: Secondary | ICD-10-CM | POA: Diagnosis not present

## 2018-09-24 DIAGNOSIS — S72001D Fracture of unspecified part of neck of right femur, subsequent encounter for closed fracture with routine healing: Secondary | ICD-10-CM | POA: Diagnosis not present

## 2018-09-24 DIAGNOSIS — E119 Type 2 diabetes mellitus without complications: Secondary | ICD-10-CM | POA: Diagnosis not present

## 2018-09-24 DIAGNOSIS — R609 Edema, unspecified: Secondary | ICD-10-CM | POA: Diagnosis not present

## 2018-09-25 DIAGNOSIS — M1990 Unspecified osteoarthritis, unspecified site: Secondary | ICD-10-CM | POA: Diagnosis not present

## 2018-09-25 DIAGNOSIS — E1122 Type 2 diabetes mellitus with diabetic chronic kidney disease: Secondary | ICD-10-CM | POA: Diagnosis not present

## 2018-09-25 DIAGNOSIS — Z794 Long term (current) use of insulin: Secondary | ICD-10-CM | POA: Diagnosis not present

## 2018-09-25 DIAGNOSIS — W19XXXD Unspecified fall, subsequent encounter: Secondary | ICD-10-CM | POA: Diagnosis not present

## 2018-09-25 DIAGNOSIS — I5030 Unspecified diastolic (congestive) heart failure: Secondary | ICD-10-CM | POA: Diagnosis not present

## 2018-09-25 DIAGNOSIS — S92302D Fracture of unspecified metatarsal bone(s), left foot, subsequent encounter for fracture with routine healing: Secondary | ICD-10-CM | POA: Diagnosis not present

## 2018-09-25 DIAGNOSIS — Z79899 Other long term (current) drug therapy: Secondary | ICD-10-CM | POA: Diagnosis not present

## 2018-09-25 DIAGNOSIS — D649 Anemia, unspecified: Secondary | ICD-10-CM | POA: Diagnosis not present

## 2018-09-25 DIAGNOSIS — Z9181 History of falling: Secondary | ICD-10-CM | POA: Diagnosis not present

## 2018-09-25 DIAGNOSIS — M810 Age-related osteoporosis without current pathological fracture: Secondary | ICD-10-CM | POA: Diagnosis not present

## 2018-09-25 DIAGNOSIS — I130 Hypertensive heart and chronic kidney disease with heart failure and stage 1 through stage 4 chronic kidney disease, or unspecified chronic kidney disease: Secondary | ICD-10-CM | POA: Diagnosis not present

## 2018-09-25 DIAGNOSIS — H3530 Unspecified macular degeneration: Secondary | ICD-10-CM | POA: Diagnosis not present

## 2018-09-25 DIAGNOSIS — K7581 Nonalcoholic steatohepatitis (NASH): Secondary | ICD-10-CM | POA: Diagnosis not present

## 2018-09-25 DIAGNOSIS — N184 Chronic kidney disease, stage 4 (severe): Secondary | ICD-10-CM | POA: Diagnosis not present

## 2018-09-25 DIAGNOSIS — S72401D Unspecified fracture of lower end of right femur, subsequent encounter for closed fracture with routine healing: Secondary | ICD-10-CM | POA: Diagnosis not present

## 2018-10-01 DIAGNOSIS — S72491D Other fracture of lower end of right femur, subsequent encounter for closed fracture with routine healing: Secondary | ICD-10-CM | POA: Diagnosis not present

## 2018-10-01 DIAGNOSIS — M170 Bilateral primary osteoarthritis of knee: Secondary | ICD-10-CM | POA: Diagnosis not present

## 2018-10-09 DIAGNOSIS — M810 Age-related osteoporosis without current pathological fracture: Secondary | ICD-10-CM | POA: Diagnosis present

## 2018-10-09 DIAGNOSIS — E7220 Disorder of urea cycle metabolism, unspecified: Secondary | ICD-10-CM | POA: Diagnosis not present

## 2018-10-09 DIAGNOSIS — N390 Urinary tract infection, site not specified: Secondary | ICD-10-CM | POA: Diagnosis not present

## 2018-10-09 DIAGNOSIS — R410 Disorientation, unspecified: Secondary | ICD-10-CM | POA: Diagnosis not present

## 2018-10-09 DIAGNOSIS — K7460 Unspecified cirrhosis of liver: Secondary | ICD-10-CM | POA: Diagnosis present

## 2018-10-09 DIAGNOSIS — B9620 Unspecified Escherichia coli [E. coli] as the cause of diseases classified elsewhere: Secondary | ICD-10-CM | POA: Diagnosis present

## 2018-10-09 DIAGNOSIS — I5032 Chronic diastolic (congestive) heart failure: Secondary | ICD-10-CM | POA: Diagnosis present

## 2018-10-09 DIAGNOSIS — I130 Hypertensive heart and chronic kidney disease with heart failure and stage 1 through stage 4 chronic kidney disease, or unspecified chronic kidney disease: Secondary | ICD-10-CM | POA: Diagnosis not present

## 2018-10-09 DIAGNOSIS — D6959 Other secondary thrombocytopenia: Secondary | ICD-10-CM | POA: Diagnosis present

## 2018-10-09 DIAGNOSIS — K766 Portal hypertension: Secondary | ICD-10-CM | POA: Diagnosis not present

## 2018-10-09 DIAGNOSIS — E1122 Type 2 diabetes mellitus with diabetic chronic kidney disease: Secondary | ICD-10-CM | POA: Diagnosis present

## 2018-10-09 DIAGNOSIS — D631 Anemia in chronic kidney disease: Secondary | ICD-10-CM | POA: Diagnosis present

## 2018-10-09 DIAGNOSIS — N179 Acute kidney failure, unspecified: Secondary | ICD-10-CM | POA: Diagnosis not present

## 2018-10-09 DIAGNOSIS — K219 Gastro-esophageal reflux disease without esophagitis: Secondary | ICD-10-CM | POA: Diagnosis present

## 2018-10-09 DIAGNOSIS — Z79899 Other long term (current) drug therapy: Secondary | ICD-10-CM | POA: Diagnosis not present

## 2018-10-09 DIAGNOSIS — D692 Other nonthrombocytopenic purpura: Secondary | ICD-10-CM | POA: Diagnosis present

## 2018-10-09 DIAGNOSIS — K7290 Hepatic failure, unspecified without coma: Secondary | ICD-10-CM | POA: Diagnosis present

## 2018-10-09 DIAGNOSIS — R918 Other nonspecific abnormal finding of lung field: Secondary | ICD-10-CM | POA: Diagnosis not present

## 2018-10-09 DIAGNOSIS — K760 Fatty (change of) liver, not elsewhere classified: Secondary | ICD-10-CM | POA: Diagnosis present

## 2018-10-09 DIAGNOSIS — Z794 Long term (current) use of insulin: Secondary | ICD-10-CM | POA: Diagnosis not present

## 2018-10-09 DIAGNOSIS — Z66 Do not resuscitate: Secondary | ICD-10-CM | POA: Diagnosis present

## 2018-10-09 DIAGNOSIS — K5900 Constipation, unspecified: Secondary | ICD-10-CM | POA: Diagnosis not present

## 2018-10-09 DIAGNOSIS — J9811 Atelectasis: Secondary | ICD-10-CM | POA: Diagnosis not present

## 2018-10-09 DIAGNOSIS — N184 Chronic kidney disease, stage 4 (severe): Secondary | ICD-10-CM | POA: Diagnosis not present

## 2018-10-09 DIAGNOSIS — D731 Hypersplenism: Secondary | ICD-10-CM | POA: Diagnosis present

## 2018-10-17 DIAGNOSIS — N184 Chronic kidney disease, stage 4 (severe): Secondary | ICD-10-CM | POA: Diagnosis not present

## 2018-10-17 DIAGNOSIS — N179 Acute kidney failure, unspecified: Secondary | ICD-10-CM | POA: Diagnosis not present

## 2018-10-17 DIAGNOSIS — D6959 Other secondary thrombocytopenia: Secondary | ICD-10-CM | POA: Diagnosis not present

## 2018-10-17 DIAGNOSIS — Z6826 Body mass index (BMI) 26.0-26.9, adult: Secondary | ICD-10-CM | POA: Diagnosis not present

## 2018-10-17 DIAGNOSIS — D692 Other nonthrombocytopenic purpura: Secondary | ICD-10-CM | POA: Diagnosis not present

## 2018-10-17 DIAGNOSIS — E1122 Type 2 diabetes mellitus with diabetic chronic kidney disease: Secondary | ICD-10-CM | POA: Diagnosis not present

## 2018-10-17 DIAGNOSIS — K766 Portal hypertension: Secondary | ICD-10-CM | POA: Diagnosis not present

## 2018-10-17 DIAGNOSIS — K7460 Unspecified cirrhosis of liver: Secondary | ICD-10-CM | POA: Diagnosis not present

## 2018-10-17 DIAGNOSIS — K7200 Acute and subacute hepatic failure without coma: Secondary | ICD-10-CM | POA: Diagnosis not present

## 2018-10-26 DIAGNOSIS — K7581 Nonalcoholic steatohepatitis (NASH): Secondary | ICD-10-CM | POA: Diagnosis present

## 2018-10-26 DIAGNOSIS — R4182 Altered mental status, unspecified: Secondary | ICD-10-CM | POA: Diagnosis not present

## 2018-10-26 DIAGNOSIS — N184 Chronic kidney disease, stage 4 (severe): Secondary | ICD-10-CM | POA: Diagnosis present

## 2018-10-26 DIAGNOSIS — Z87310 Personal history of (healed) osteoporosis fracture: Secondary | ICD-10-CM | POA: Diagnosis not present

## 2018-10-26 DIAGNOSIS — R404 Transient alteration of awareness: Secondary | ICD-10-CM | POA: Diagnosis not present

## 2018-10-26 DIAGNOSIS — M810 Age-related osteoporosis without current pathological fracture: Secondary | ICD-10-CM | POA: Diagnosis present

## 2018-10-26 DIAGNOSIS — Z66 Do not resuscitate: Secondary | ICD-10-CM | POA: Diagnosis not present

## 2018-10-26 DIAGNOSIS — M1990 Unspecified osteoarthritis, unspecified site: Secondary | ICD-10-CM | POA: Diagnosis present

## 2018-10-26 DIAGNOSIS — N189 Chronic kidney disease, unspecified: Secondary | ICD-10-CM | POA: Diagnosis not present

## 2018-10-26 DIAGNOSIS — K219 Gastro-esophageal reflux disease without esophagitis: Secondary | ICD-10-CM | POA: Diagnosis present

## 2018-10-26 DIAGNOSIS — D631 Anemia in chronic kidney disease: Secondary | ICD-10-CM | POA: Diagnosis present

## 2018-10-26 DIAGNOSIS — Z87440 Personal history of urinary (tract) infections: Secondary | ICD-10-CM | POA: Diagnosis not present

## 2018-10-26 DIAGNOSIS — Z794 Long term (current) use of insulin: Secondary | ICD-10-CM | POA: Diagnosis not present

## 2018-10-26 DIAGNOSIS — Z7983 Long term (current) use of bisphosphonates: Secondary | ICD-10-CM | POA: Diagnosis not present

## 2018-10-26 DIAGNOSIS — R5381 Other malaise: Secondary | ICD-10-CM | POA: Diagnosis present

## 2018-10-26 DIAGNOSIS — I130 Hypertensive heart and chronic kidney disease with heart failure and stage 1 through stage 4 chronic kidney disease, or unspecified chronic kidney disease: Secondary | ICD-10-CM | POA: Diagnosis present

## 2018-10-26 DIAGNOSIS — E1122 Type 2 diabetes mellitus with diabetic chronic kidney disease: Secondary | ICD-10-CM | POA: Diagnosis present

## 2018-10-26 DIAGNOSIS — K7460 Unspecified cirrhosis of liver: Secondary | ICD-10-CM | POA: Diagnosis not present

## 2018-10-26 DIAGNOSIS — K7200 Acute and subacute hepatic failure without coma: Secondary | ICD-10-CM | POA: Diagnosis present

## 2018-10-26 DIAGNOSIS — E876 Hypokalemia: Secondary | ICD-10-CM | POA: Diagnosis not present

## 2018-10-26 DIAGNOSIS — D649 Anemia, unspecified: Secondary | ICD-10-CM | POA: Diagnosis not present

## 2018-10-26 DIAGNOSIS — K7290 Hepatic failure, unspecified without coma: Secondary | ICD-10-CM | POA: Diagnosis not present

## 2018-10-26 DIAGNOSIS — R4020 Unspecified coma: Secondary | ICD-10-CM | POA: Diagnosis not present

## 2018-10-26 DIAGNOSIS — D692 Other nonthrombocytopenic purpura: Secondary | ICD-10-CM | POA: Diagnosis present

## 2018-10-26 DIAGNOSIS — E7220 Disorder of urea cycle metabolism, unspecified: Secondary | ICD-10-CM | POA: Diagnosis not present

## 2018-10-26 DIAGNOSIS — I5032 Chronic diastolic (congestive) heart failure: Secondary | ICD-10-CM | POA: Diagnosis present

## 2018-11-04 DIAGNOSIS — K7200 Acute and subacute hepatic failure without coma: Secondary | ICD-10-CM | POA: Diagnosis not present

## 2018-11-04 DIAGNOSIS — K7460 Unspecified cirrhosis of liver: Secondary | ICD-10-CM | POA: Diagnosis not present

## 2018-11-04 DIAGNOSIS — D692 Other nonthrombocytopenic purpura: Secondary | ICD-10-CM | POA: Diagnosis not present

## 2018-11-12 DIAGNOSIS — K766 Portal hypertension: Secondary | ICD-10-CM | POA: Diagnosis not present

## 2018-11-12 DIAGNOSIS — K7200 Acute and subacute hepatic failure without coma: Secondary | ICD-10-CM | POA: Diagnosis not present

## 2018-11-12 DIAGNOSIS — K7460 Unspecified cirrhosis of liver: Secondary | ICD-10-CM | POA: Diagnosis not present

## 2018-11-12 DIAGNOSIS — E1122 Type 2 diabetes mellitus with diabetic chronic kidney disease: Secondary | ICD-10-CM | POA: Diagnosis not present

## 2018-11-12 DIAGNOSIS — D6959 Other secondary thrombocytopenia: Secondary | ICD-10-CM | POA: Diagnosis not present

## 2018-11-12 DIAGNOSIS — N179 Acute kidney failure, unspecified: Secondary | ICD-10-CM | POA: Diagnosis not present

## 2018-11-12 DIAGNOSIS — D692 Other nonthrombocytopenic purpura: Secondary | ICD-10-CM | POA: Diagnosis not present

## 2018-11-12 DIAGNOSIS — N184 Chronic kidney disease, stage 4 (severe): Secondary | ICD-10-CM | POA: Diagnosis not present

## 2018-11-14 DIAGNOSIS — D649 Anemia, unspecified: Secondary | ICD-10-CM | POA: Diagnosis not present

## 2018-11-14 DIAGNOSIS — E119 Type 2 diabetes mellitus without complications: Secondary | ICD-10-CM | POA: Diagnosis not present

## 2018-11-14 DIAGNOSIS — Z794 Long term (current) use of insulin: Secondary | ICD-10-CM | POA: Diagnosis not present

## 2018-11-14 DIAGNOSIS — K7290 Hepatic failure, unspecified without coma: Secondary | ICD-10-CM | POA: Diagnosis present

## 2018-11-14 DIAGNOSIS — I8500 Esophageal varices without bleeding: Secondary | ICD-10-CM | POA: Diagnosis not present

## 2018-11-14 DIAGNOSIS — J9601 Acute respiratory failure with hypoxia: Secondary | ICD-10-CM | POA: Diagnosis not present

## 2018-11-14 DIAGNOSIS — R4182 Altered mental status, unspecified: Secondary | ICD-10-CM | POA: Diagnosis not present

## 2018-11-14 DIAGNOSIS — G9340 Encephalopathy, unspecified: Secondary | ICD-10-CM | POA: Diagnosis not present

## 2018-11-14 DIAGNOSIS — Z66 Do not resuscitate: Secondary | ICD-10-CM | POA: Diagnosis present

## 2018-11-14 DIAGNOSIS — M810 Age-related osteoporosis without current pathological fracture: Secondary | ICD-10-CM | POA: Diagnosis present

## 2018-11-14 DIAGNOSIS — N170 Acute kidney failure with tubular necrosis: Secondary | ICD-10-CM | POA: Diagnosis present

## 2018-11-14 DIAGNOSIS — K3189 Other diseases of stomach and duodenum: Secondary | ICD-10-CM | POA: Diagnosis not present

## 2018-11-14 DIAGNOSIS — R627 Adult failure to thrive: Secondary | ICD-10-CM | POA: Diagnosis not present

## 2018-11-14 DIAGNOSIS — R188 Other ascites: Secondary | ICD-10-CM | POA: Diagnosis not present

## 2018-11-14 DIAGNOSIS — K7460 Unspecified cirrhosis of liver: Secondary | ICD-10-CM | POA: Diagnosis present

## 2018-11-14 DIAGNOSIS — Z8711 Personal history of peptic ulcer disease: Secondary | ICD-10-CM | POA: Diagnosis not present

## 2018-11-14 DIAGNOSIS — I8501 Esophageal varices with bleeding: Secondary | ICD-10-CM | POA: Diagnosis present

## 2018-11-14 DIAGNOSIS — Z9911 Dependence on respirator [ventilator] status: Secondary | ICD-10-CM | POA: Diagnosis not present

## 2018-11-14 DIAGNOSIS — H3530 Unspecified macular degeneration: Secondary | ICD-10-CM | POA: Diagnosis present

## 2018-11-14 DIAGNOSIS — D509 Iron deficiency anemia, unspecified: Secondary | ICD-10-CM | POA: Diagnosis present

## 2018-11-14 DIAGNOSIS — E639 Nutritional deficiency, unspecified: Secondary | ICD-10-CM | POA: Diagnosis not present

## 2018-11-14 DIAGNOSIS — E1122 Type 2 diabetes mellitus with diabetic chronic kidney disease: Secondary | ICD-10-CM | POA: Diagnosis present

## 2018-11-14 DIAGNOSIS — K922 Gastrointestinal hemorrhage, unspecified: Secondary | ICD-10-CM | POA: Diagnosis not present

## 2018-11-14 DIAGNOSIS — K766 Portal hypertension: Secondary | ICD-10-CM | POA: Diagnosis present

## 2018-11-14 DIAGNOSIS — L89151 Pressure ulcer of sacral region, stage 1: Secondary | ICD-10-CM | POA: Diagnosis present

## 2018-11-14 DIAGNOSIS — Z9049 Acquired absence of other specified parts of digestive tract: Secondary | ICD-10-CM | POA: Diagnosis not present

## 2018-11-14 DIAGNOSIS — D62 Acute posthemorrhagic anemia: Secondary | ICD-10-CM | POA: Diagnosis present

## 2018-11-14 DIAGNOSIS — T68XXXA Hypothermia, initial encounter: Secondary | ICD-10-CM | POA: Diagnosis not present

## 2018-11-14 DIAGNOSIS — R0602 Shortness of breath: Secondary | ICD-10-CM | POA: Diagnosis not present

## 2018-11-14 DIAGNOSIS — R5383 Other fatigue: Secondary | ICD-10-CM | POA: Diagnosis not present

## 2018-11-14 DIAGNOSIS — E860 Dehydration: Secondary | ICD-10-CM | POA: Diagnosis not present

## 2018-11-14 DIAGNOSIS — K219 Gastro-esophageal reflux disease without esophagitis: Secondary | ICD-10-CM | POA: Diagnosis present

## 2018-11-14 DIAGNOSIS — Z79899 Other long term (current) drug therapy: Secondary | ICD-10-CM | POA: Diagnosis not present

## 2018-11-14 DIAGNOSIS — E878 Other disorders of electrolyte and fluid balance, not elsewhere classified: Secondary | ICD-10-CM | POA: Diagnosis not present

## 2018-11-14 DIAGNOSIS — N183 Chronic kidney disease, stage 3 (moderate): Secondary | ICD-10-CM | POA: Diagnosis present

## 2018-11-14 DIAGNOSIS — I5032 Chronic diastolic (congestive) heart failure: Secondary | ICD-10-CM | POA: Diagnosis present

## 2018-11-14 DIAGNOSIS — K7581 Nonalcoholic steatohepatitis (NASH): Secondary | ICD-10-CM | POA: Diagnosis present

## 2018-11-14 DIAGNOSIS — K920 Hematemesis: Secondary | ICD-10-CM | POA: Diagnosis not present

## 2018-11-14 DIAGNOSIS — Z515 Encounter for palliative care: Secondary | ICD-10-CM | POA: Diagnosis not present

## 2018-11-14 DIAGNOSIS — N179 Acute kidney failure, unspecified: Secondary | ICD-10-CM | POA: Diagnosis not present

## 2018-11-14 DIAGNOSIS — R1111 Vomiting without nausea: Secondary | ICD-10-CM | POA: Diagnosis not present

## 2018-11-14 DIAGNOSIS — Z781 Physical restraint status: Secondary | ICD-10-CM | POA: Diagnosis not present

## 2018-11-14 DIAGNOSIS — R404 Transient alteration of awareness: Secondary | ICD-10-CM | POA: Diagnosis not present

## 2018-11-14 DIAGNOSIS — E861 Hypovolemia: Secondary | ICD-10-CM | POA: Diagnosis not present

## 2018-11-14 DIAGNOSIS — J9600 Acute respiratory failure, unspecified whether with hypoxia or hypercapnia: Secondary | ICD-10-CM | POA: Diagnosis not present

## 2018-11-14 DIAGNOSIS — I130 Hypertensive heart and chronic kidney disease with heart failure and stage 1 through stage 4 chronic kidney disease, or unspecified chronic kidney disease: Secondary | ICD-10-CM | POA: Diagnosis present

## 2018-11-29 DIAGNOSIS — K7200 Acute and subacute hepatic failure without coma: Secondary | ICD-10-CM | POA: Diagnosis not present

## 2018-11-29 DIAGNOSIS — D692 Other nonthrombocytopenic purpura: Secondary | ICD-10-CM | POA: Diagnosis not present

## 2018-11-29 DIAGNOSIS — K7460 Unspecified cirrhosis of liver: Secondary | ICD-10-CM | POA: Diagnosis not present

## 2018-12-17 DEATH — deceased
